# Patient Record
Sex: Male | Born: 1970
Health system: Southern US, Community
[De-identification: ages and names within clinical notes are randomized; demographics above are authoritative.]

## PROBLEM LIST (undated history)

## (undated) DIAGNOSIS — K2950 Unspecified chronic gastritis without bleeding: Secondary | ICD-10-CM

## (undated) DIAGNOSIS — M545 Low back pain, unspecified: Secondary | ICD-10-CM

## (undated) DIAGNOSIS — E785 Hyperlipidemia, unspecified: Secondary | ICD-10-CM

## (undated) DIAGNOSIS — K635 Polyp of colon: Secondary | ICD-10-CM

## (undated) DIAGNOSIS — T7840XA Allergy, unspecified, initial encounter: Secondary | ICD-10-CM

## (undated) DIAGNOSIS — N281 Cyst of kidney, acquired: Secondary | ICD-10-CM

## (undated) DIAGNOSIS — K7689 Other specified diseases of liver: Secondary | ICD-10-CM

## (undated) DIAGNOSIS — K219 Gastro-esophageal reflux disease without esophagitis: Secondary | ICD-10-CM

## (undated) DIAGNOSIS — K449 Diaphragmatic hernia without obstruction or gangrene: Secondary | ICD-10-CM

## (undated) DIAGNOSIS — F419 Anxiety disorder, unspecified: Secondary | ICD-10-CM

## (undated) DIAGNOSIS — B019 Varicella without complication: Secondary | ICD-10-CM

## (undated) DIAGNOSIS — I10 Essential (primary) hypertension: Secondary | ICD-10-CM

## (undated) DIAGNOSIS — D131 Benign neoplasm of stomach: Secondary | ICD-10-CM

## (undated) DIAGNOSIS — K2970 Gastritis, unspecified, without bleeding: Secondary | ICD-10-CM

## (undated) DIAGNOSIS — M1990 Unspecified osteoarthritis, unspecified site: Secondary | ICD-10-CM

## (undated) HISTORY — DX: Unspecified osteoarthritis, unspecified site: M19.90

## (undated) HISTORY — PX: ESOPHAGOGASTRODUODENOSCOPY: SHX1529

## (undated) HISTORY — DX: Cyst of kidney, acquired: N28.1

## (undated) HISTORY — DX: Varicella without complication: B01.9

## (undated) HISTORY — DX: Gastritis, unspecified, without bleeding: K29.70

## (undated) HISTORY — DX: Other specified diseases of liver: K76.89

## (undated) HISTORY — PX: APPENDECTOMY: SHX54

## (undated) HISTORY — PX: COLONOSCOPY W/ POLYPECTOMY: SHX1380

## (undated) HISTORY — DX: Gastro-esophageal reflux disease without esophagitis: K21.9

## (undated) HISTORY — PX: TONSILLECTOMY: SUR1361

## (undated) HISTORY — DX: Anxiety disorder, unspecified: F41.9

## (undated) HISTORY — DX: Low back pain: M54.5

## (undated) HISTORY — DX: Allergy, unspecified, initial encounter: T78.40XA

## (undated) HISTORY — DX: Hyperlipidemia, unspecified: E78.5

## (undated) HISTORY — DX: Diaphragmatic hernia without obstruction or gangrene: K44.9

## (undated) HISTORY — PX: LASIK: SHX215

## (undated) HISTORY — PX: EYE SURGERY: SHX253

## (undated) HISTORY — PX: KNEE SURGERY: SHX244

## (undated) HISTORY — DX: Polyp of colon: K63.5

## (undated) HISTORY — DX: Unspecified chronic gastritis without bleeding: K29.50

## (undated) HISTORY — DX: Low back pain, unspecified: M54.50

## (undated) HISTORY — DX: Essential (primary) hypertension: I10

## (undated) HISTORY — DX: Benign neoplasm of stomach: D13.1

---

## 2018-04-13 DIAGNOSIS — K219 Gastro-esophageal reflux disease without esophagitis: Secondary | ICD-10-CM | POA: Insufficient documentation

## 2018-04-13 DIAGNOSIS — Z860100 Personal history of colon polyps, unspecified: Secondary | ICD-10-CM | POA: Insufficient documentation

## 2018-06-15 DIAGNOSIS — I10 Essential (primary) hypertension: Secondary | ICD-10-CM | POA: Insufficient documentation

## 2018-12-21 ENCOUNTER — Ambulatory Visit: Payer: BLUE CROSS/BLUE SHIELD | Admitting: Internal Medicine

## 2018-12-21 ENCOUNTER — Encounter: Payer: Self-pay | Admitting: Internal Medicine

## 2018-12-21 VITALS — BP 130/92 | HR 63 | Temp 98.7°F | Ht 66.0 in | Wt 159.6 lb

## 2018-12-21 DIAGNOSIS — Z1283 Encounter for screening for malignant neoplasm of skin: Secondary | ICD-10-CM

## 2018-12-21 DIAGNOSIS — Z1159 Encounter for screening for other viral diseases: Secondary | ICD-10-CM

## 2018-12-21 DIAGNOSIS — I10 Essential (primary) hypertension: Secondary | ICD-10-CM | POA: Insufficient documentation

## 2018-12-21 DIAGNOSIS — K219 Gastro-esophageal reflux disease without esophagitis: Secondary | ICD-10-CM

## 2018-12-21 DIAGNOSIS — Z0184 Encounter for antibody response examination: Secondary | ICD-10-CM

## 2018-12-21 DIAGNOSIS — Z1389 Encounter for screening for other disorder: Secondary | ICD-10-CM

## 2018-12-21 DIAGNOSIS — Z1329 Encounter for screening for other suspected endocrine disorder: Secondary | ICD-10-CM | POA: Diagnosis not present

## 2018-12-21 DIAGNOSIS — E559 Vitamin D deficiency, unspecified: Secondary | ICD-10-CM

## 2018-12-21 DIAGNOSIS — Z125 Encounter for screening for malignant neoplasm of prostate: Secondary | ICD-10-CM

## 2018-12-21 MED ORDER — PANTOPRAZOLE SODIUM 40 MG PO TBEC: 40.0000 mg | DELAYED_RELEASE_TABLET | Freq: Every day | ORAL | 3 refills | Status: DC

## 2018-12-21 MED ORDER — LOSARTAN POTASSIUM-HCTZ 50-12.5 MG PO TABS: 1.0000 | ORAL_TABLET | Freq: Every day | ORAL | 3 refills | Status: DC

## 2018-12-21 NOTE — Progress Notes (Signed)
Pre visit review using our clinic review tool, if applicable. No additional management support is needed unless otherwise documented below in the visit note. 

## 2018-12-21 NOTE — Progress Notes (Addendum)
Chief Complaint  Patient presents with  . Establish Care   New patient  1. HTN sl elevated missed hyzaar 50-12.5 last night  2. GERD controlled on protonix 40 mg and diet control   Review of Systems  Constitutional: Negative for weight loss.  HENT: Negative for hearing loss.   Eyes: Negative for blurred vision.  Respiratory: Negative for shortness of breath.   Cardiovascular: Negative for chest pain.  Gastrointestinal: Negative for abdominal pain.  Musculoskeletal: Negative for falls.  Skin: Negative for rash.  Neurological: Negative for headaches.  Psychiatric/Behavioral: Negative for depression.   Past Medical History:  Diagnosis Date  . Chicken pox   . Colon polyps   . GERD (gastroesophageal reflux disease)   . Hypertension    Past Surgical History:  Procedure Laterality Date  . APPENDECTOMY    . KNEE SURGERY     arthroscopy x 2 torn cartilage   . TONSILLECTOMY     Family History  Problem Relation Age of Onset  . Diabetes Mother   . Hypertension Mother   . Diabetes Father   . Hypertension Father   . Hyperlipidemia Father   . Diabetes Maternal Grandmother   . Heart disease Maternal Grandfather        MI age 58s-50s   . Hyperlipidemia Paternal Grandmother   . Diabetes Paternal Grandfather   . Cancer Paternal Grandfather        lung cancer smoker    Social History   Socioeconomic History  . Marital status: Married    Spouse name: Not on file  . Number of children: Not on file  . Years of education: Not on file  . Highest education level: Not on file  Occupational History  . Not on file  Social Needs  . Financial resource strain: Not on file  . Food insecurity:    Worry: Not on file    Inability: Not on file  . Transportation needs:    Medical: Not on file    Non-medical: Not on file  Tobacco Use  . Smoking status: Never Smoker  . Smokeless tobacco: Never Used  Substance and Sexual Activity  . Alcohol use: Yes  . Drug use: Not Currently  .  Sexual activity: Yes  Lifestyle  . Physical activity:    Days per week: Not on file    Minutes per session: Not on file  . Stress: Not on file  Relationships  . Social connections:    Talks on phone: Not on file    Gets together: Not on file    Attends religious service: Not on file    Active member of club or organization: Not on file    Attends meetings of clubs or organizations: Not on file    Relationship status: Not on file  . Intimate partner violence:    Fear of current or ex partner: Not on file    Emotionally abused: Not on file    Physically abused: Not on file    Forced sexual activity: Not on file  Other Topics Concern  . Not on file  Social History Narrative   Married    Moved from [place] in [employer] degree   Kids    No guns   Wears seat belt    Safe in relationship    Current Meds  Medication Sig  . fexofenadine (ALLEGRA) 180 MG tablet Take 180 mg by mouth daily as needed for allergies or rhinitis.  Marland Kitchen losartan-hydrochlorothiazide (HYZAAR) 50-12.5  MG tablet Take 1 tablet by mouth daily.  . pantoprazole (PROTONIX) 40 MG tablet Take 1 tablet (40 mg total) by mouth daily. 30 minutes before food  . [DISCONTINUED] losartan-hydrochlorothiazide (HYZAAR) 50-12.5 MG tablet Take 1 tablet by mouth daily.  . [DISCONTINUED] pantoprazole (PROTONIX) 40 MG tablet Take 40 mg by mouth daily.   Allergies  Allergen Reactions  . Penicillins Rash    Rash as chid but able to tolerate amoxicillin    No results found for this or any previous visit (from the past 2160 hour(s)). Objective  Body mass index is 25.76 kg/m. Wt Readings from Last 3 Encounters:  12/21/18 159 lb 9.6 oz (72.4 kg)   Temp Readings from Last 3 Encounters:  12/21/18 98.7 F (37.1 C) (Oral)   BP Readings from Last 3 Encounters:  12/21/18 (!) 130/92   Pulse Readings from Last 3 Encounters:  12/21/18 63    Physical Exam Vitals signs and nursing note reviewed.  Constitutional:       Appearance: Normal appearance.  HENT:     Head: Normocephalic and atraumatic.     Nose: Nose normal.     Mouth/Throat:     Mouth: Mucous membranes are moist.     Pharynx: Oropharynx is clear.  Eyes:     Conjunctiva/sclera: Conjunctivae normal.     Pupils: Pupils are equal, round, and reactive to light.  Cardiovascular:     Rate and Rhythm: Normal rate and regular rhythm.     Heart sounds: Normal heart sounds.  Pulmonary:     Effort: Pulmonary effort is normal.     Breath sounds: Normal breath sounds.  Skin:    General: Skin is warm and dry.  Neurological:     General: No focal deficit present.     Mental Status: He is alert and oriented to person, place, and time. Mental status is at baseline.     Gait: Gait normal.  Psychiatric:        Attention and Perception: Attention and perception normal.        Mood and Affect: Mood and affect normal.        Speech: Speech normal.        Behavior: Behavior normal. Behavior is cooperative.        Thought Content: Thought content normal.        Cognition and Memory: Cognition and memory normal.        Judgment: Judgment normal.     Assessment   1. HTN 2. GERD 3. Nevi and angiomas 4. HM Plan   1. Refilled hyzaar 50-12.5  2. Refilled protonix 40 mg qd  3. Referred Dr. Kellie Moor dermatology  4.  Never had flu shot  Check vxs (I.e Tdap) old PCP  Consider shingrix vaccine   Check colonoscopy old PCP had x 2 dad h/o polyps not cancerous had age 25 and 53 , also had labs 07/2018 -no colonoscopy in records  Records Dr. Ferol Luz in [place] [phone] received and reviewed  H/o GERD HLD Gastritis/esophagitis  Chronic right sided back pain   Eye exam 2019  Referred Dr. Kellie Moor tbse and disc laser angiomas face  Never smoker  Physical at f/u  Provider: Dr. Olivia Mackie [facility]

## 2018-12-21 NOTE — Patient Instructions (Addendum)
Dr. Hinton Rao dermatology [address] [place] [phone]   Cobb stands for "Dietary Approaches to Stop Hypertension." The DASH eating plan is a healthy eating plan that has been shown to reduce high blood pressure (hypertension). It may also reduce your risk for type 2 diabetes, heart disease, and stroke. The DASH eating plan may also help with weight loss. What are tips for following this plan?  General guidelines  Avoid eating more than 2,300 mg (milligrams) of salt (sodium) a day. If you have hypertension, you may need to reduce your sodium intake to 1,500 mg a day.  Limit alcohol intake to no more than 1 drink a day for nonpregnant women and 2 drinks a day for men. One drink equals 12 oz of beer, 5 oz of wine, or 1 oz of hard liquor.  Work with your health care provider to maintain a healthy body weight or to lose weight. Ask what an ideal weight is for you.  Get at least 30 minutes of exercise that causes your heart to beat faster (aerobic exercise) most days of the week. Activities may include walking, swimming, or biking.  Work with your health care provider or diet and nutrition specialist (dietitian) to adjust your eating plan to your individual calorie needs. Reading food labels   Check food labels for the amount of sodium per serving. Choose foods with less than 5 percent of the Daily Value of sodium. Generally, foods with less than 300 mg of sodium per serving fit into this eating plan.  To find whole grains, look for the word "whole" as the first word in the ingredient list. Shopping  Buy products labeled as "low-sodium" or "no salt added."  Buy fresh foods. Avoid canned foods and premade or frozen meals. Cooking  Avoid adding salt when cooking. Use salt-free seasonings or herbs instead of table salt or sea salt. Check with your health care provider or pharmacist before using salt substitutes.  Do not fry foods. Cook foods using healthy methods such  as baking, boiling, grilling, and broiling instead.  Cook with heart-healthy oils, such as olive, canola, soybean, or sunflower oil. Meal planning  Eat a balanced diet that includes: ? 5 or more servings of fruits and vegetables each day. At each meal, try to fill half of your plate with fruits and vegetables. ? Up to 6-8 servings of whole grains each day. ? Less than 6 oz of lean meat, poultry, or fish each day. A 3-oz serving of meat is about the same size as a deck of cards. One egg equals 1 oz. ? 2 servings of low-fat dairy each day. ? A serving of nuts, seeds, or beans 5 times each week. ? Heart-healthy fats. Healthy fats called Omega-3 fatty acids are found in foods such as flaxseeds and coldwater fish, like sardines, salmon, and mackerel.  Limit how much you eat of the following: ? Canned or prepackaged foods. ? Food that is high in trans fat, such as fried foods. ? Food that is high in saturated fat, such as fatty meat. ? Sweets, desserts, sugary drinks, and other foods with added sugar. ? Full-fat dairy products.  Do not salt foods before eating.  Try to eat at least 2 vegetarian meals each week.  Eat more home-cooked food and less restaurant, buffet, and fast food.  When eating at a restaurant, ask that your food be prepared with less salt or no salt, if possible. What foods are recommended? The  items listed may not be a complete list. Talk with your dietitian about what dietary choices are best for you. Grains Whole-grain or whole-wheat bread. Whole-grain or whole-wheat pasta. Brown rice. [place] Morrow. Bulgur. Whole-grain and low-sodium cereals. Pita bread. Low-fat, low-sodium crackers. Whole-wheat flour tortillas. Vegetables Fresh or frozen vegetables (raw, steamed, roasted, or grilled). Low-sodium or reduced-sodium tomato and vegetable juice. Low-sodium or reduced-sodium tomato sauce and tomato paste. Low-sodium or reduced-sodium canned vegetables. Fruits All fresh,  dried, or frozen fruit. Canned fruit in natural juice (without added sugar). Meat and other protein foods Skinless chicken or [place]. Ground chicken or [place]. Pork with fat trimmed off. Fish and seafood. Egg whites. Dried beans, peas, or lentils. Unsalted nuts, nut butters, and seeds. Unsalted canned beans. Lean cuts of beef with fat trimmed off. Low-sodium, lean deli meat. Dairy Low-fat (1%) or fat-free (skim) milk. Fat-free, low-fat, or reduced-fat cheeses. Nonfat, low-sodium ricotta or cottage cheese. Low-fat or nonfat yogurt. Low-fat, low-sodium cheese. Fats and oils Soft margarine without trans fats. Vegetable oil. Low-fat, reduced-fat, or light mayonnaise and salad dressings (reduced-sodium). Canola, safflower, olive, soybean, and sunflower oils. Avocado. Seasoning and other foods Herbs. Spices. Seasoning mixes without salt. Unsalted popcorn and pretzels. Fat-free sweets. What foods are not recommended? The items listed may not be a complete list. Talk with your dietitian about what dietary choices are best for you. Grains Baked goods made with fat, such as croissants, muffins, or some breads. Dry pasta or rice meal packs. Vegetables Creamed or fried vegetables. Vegetables in a cheese sauce. Regular canned vegetables (not low-sodium or reduced-sodium). Regular canned tomato sauce and paste (not low-sodium or reduced-sodium). Regular tomato and vegetable juice (not low-sodium or reduced-sodium). Angie Fava. Olives. Fruits Canned fruit in a light or heavy syrup. Fried fruit. Fruit in cream or butter sauce. Meat and other protein foods Fatty cuts of meat. Ribs. Fried meat. Berniece Salines. Sausage. Bologna and other processed lunch meats. Salami. Fatback. Hotdogs. Bratwurst. Salted nuts and seeds. Canned beans with added salt. Canned or smoked fish. Whole eggs or egg yolks. Chicken or [place] with skin. Dairy Whole or 2% milk, cream, and half-and-half. Whole or full-fat cream cheese. Whole-fat or sweetened  yogurt. Full-fat cheese. Nondairy creamers. Whipped toppings. Processed cheese and cheese spreads. Fats and oils Butter. Stick margarine. Lard. Shortening. Ghee. Bacon fat. Tropical oils, such as coconut, palm kernel, or palm oil. Seasoning and other foods Salted popcorn and pretzels. Onion salt, garlic salt, seasoned salt, table salt, and sea salt. Worcestershire sauce. Tartar sauce. Barbecue sauce. Teriyaki sauce. Soy sauce, including reduced-sodium. Steak sauce. Canned and packaged gravies. Fish sauce. Oyster sauce. Cocktail sauce. Horseradish that you find on the shelf. Ketchup. Mustard. Meat flavorings and tenderizers. Bouillon cubes. Hot sauce and Tabasco sauce. Premade or packaged marinades. Premade or packaged taco seasonings. Relishes. Regular salad dressings. Where to find more information:  National Heart, Lung, and [place]: https://wilson-eaton.com/  American Heart Association: www.heart.org Summary  The DASH eating plan is a healthy eating plan that has been shown to reduce high blood pressure (hypertension). It may also reduce your risk for type 2 diabetes, heart disease, and stroke.  With the DASH eating plan, you should limit salt (sodium) intake to 2,300 mg a day. If you have hypertension, you may need to reduce your sodium intake to 1,500 mg a day.  When on the DASH eating plan, aim to eat more fresh fruits and vegetables, whole grains, lean proteins, low-fat dairy, and heart-healthy fats.  Work with your health care provider  or diet and nutrition specialist (dietitian) to adjust your eating plan to your individual calorie needs. This information is not intended to replace advice given to you by your health care provider. Make sure you discuss any questions you have with your health care provider. Document Released: 12/04/2011 Document Revised: 12/08/2016 Document Reviewed: 12/08/2016 Elsevier Interactive Patient Education  2019 [employer].  Hypertension Hypertension,  commonly called high blood pressure, is when the force of blood pumping through the arteries is too strong. The arteries are the blood vessels that carry blood from the heart throughout the body. Hypertension forces the heart to work harder to pump blood and may cause arteries to become narrow or stiff. Having untreated or uncontrolled hypertension can cause heart attacks, strokes, kidney disease, and other problems. A blood pressure reading consists of a higher number over a lower number. Ideally, your blood pressure should be below 120/80. The first ("top") number is called the systolic pressure. It is a measure of the pressure in your arteries as your heart beats. The second ("bottom") number is called the diastolic pressure. It is a measure of the pressure in your arteries as the heart relaxes. What are the causes? The cause of this condition is not known. What increases the risk? Some risk factors for high blood pressure are under your control. Others are not. Factors you can change  Smoking.  Having type 2 diabetes mellitus, high cholesterol, or both.  Not getting enough exercise or physical activity.  Being overweight.  Having too much fat, sugar, calories, or salt (sodium) in your diet.  Drinking too much alcohol. Factors that are difficult or impossible to change  Having chronic kidney disease.  Having a family history of high blood pressure.  Age. Risk increases with age.  Race. You may be at higher risk if you are African-American.  Gender. Men are at higher risk than women before age 7. After age 14, women are at higher risk than men.  Having obstructive sleep apnea.  Stress. What are the signs or symptoms? Extremely high blood pressure (hypertensive crisis) may cause:  Headache.  Anxiety.  Shortness of breath.  Nosebleed.  Nausea and vomiting.  Severe chest pain.  Jerky movements you cannot control (seizures). How is this diagnosed? This condition is  diagnosed by measuring your blood pressure while you are seated, with your arm resting on a surface. The cuff of the blood pressure monitor will be placed directly against the skin of your upper arm at the level of your heart. It should be measured at least twice using the same arm. Certain conditions can cause a difference in blood pressure between your right and left arms. Certain factors can cause blood pressure readings to be lower or higher than normal (elevated) for a short period of time:  When your blood pressure is higher when you are in a health care provider's office than when you are at home, this is called white coat hypertension. Most people with this condition do not need medicines.  When your blood pressure is higher at home than when you are in a health care provider's office, this is called masked hypertension. Most people with this condition may need medicines to control blood pressure. If you have a high blood pressure reading during one visit or you have normal blood pressure with other risk factors:  You may be asked to return on a different day to have your blood pressure checked again.  You may be asked to monitor your blood  pressure at home for 1 week or longer. If you are diagnosed with hypertension, you may have other blood or imaging tests to help your health care provider understand your overall risk for other conditions. How is this treated? This condition is treated by making healthy lifestyle changes, such as eating healthy foods, exercising more, and reducing your alcohol intake. Your health care provider may prescribe medicine if lifestyle changes are not enough to get your blood pressure under control, and if:  Your systolic blood pressure is above 130.  Your diastolic blood pressure is above 80. Your personal target blood pressure may vary depending on your medical conditions, your age, and other factors. Follow these instructions at home: Eating and  drinking   Eat a diet that is high in fiber and potassium, and low in sodium, added sugar, and fat. An example eating plan is called the DASH (Dietary Approaches to Stop Hypertension) diet. To eat this way: ? Eat plenty of fresh fruits and vegetables. Try to fill half of your plate at each meal with fruits and vegetables. ? Eat whole grains, such as whole wheat pasta, brown rice, or whole grain bread. Fill about one quarter of your plate with whole grains. ? Eat or drink low-fat dairy products, such as skim milk or low-fat yogurt. ? Avoid fatty cuts of meat, processed or cured meats, and poultry with skin. Fill about one quarter of your plate with lean proteins, such as fish, chicken without skin, beans, eggs, and tofu. ? Avoid premade and processed foods. These tend to be higher in sodium, added sugar, and fat.  Reduce your daily sodium intake. Most people with hypertension should eat less than 1,500 mg of sodium a day.  Limit alcohol intake to no more than 1 drink a day for nonpregnant women and 2 drinks a day for men. One drink equals 12 oz of beer, 5 oz of wine, or 1 oz of hard liquor. Lifestyle   Work with your health care provider to maintain a healthy body weight or to lose weight. Ask what an ideal weight is for you.  Get at least 30 minutes of exercise that causes your heart to beat faster (aerobic exercise) most days of the week. Activities may include walking, swimming, or biking.  Include exercise to strengthen your muscles (resistance exercise), such as pilates or lifting weights, as part of your weekly exercise routine. Try to do these types of exercises for 30 minutes at least 3 days a week.  Do not use any products that contain nicotine or tobacco, such as cigarettes and e-cigarettes. If you need help quitting, ask your health care provider.  Monitor your blood pressure at home as told by your health care provider.  Keep all follow-up visits as told by your health care  provider. This is important. Medicines  Take over-the-counter and prescription medicines only as told by your health care provider. Follow directions carefully. Blood pressure medicines must be taken as prescribed.  Do not skip doses of blood pressure medicine. Doing this puts you at risk for problems and can make the medicine less effective.  Ask your health care provider about side effects or reactions to medicines that you should watch for. Contact a health care provider if:  You think you are having a reaction to a medicine you are taking.  You have headaches that keep coming back (recurring).  You feel dizzy.  You have swelling in your ankles.  You have trouble with your vision. Get help  right away if:  You develop a severe headache or confusion.  You have unusual weakness or numbness.  You feel faint.  You have severe pain in your chest or abdomen.  You vomit repeatedly.  You have trouble breathing. Summary  Hypertension is when the force of blood pumping through your arteries is too strong. If this condition is not controlled, it may put you at risk for serious complications.  Your personal target blood pressure may vary depending on your medical conditions, your age, and other factors. For most people, a normal blood pressure is less than 120/80.  Hypertension is treated with lifestyle changes, medicines, or a combination of both. Lifestyle changes include weight loss, eating a healthy, low-sodium diet, exercising more, and limiting alcohol. This information is not intended to replace advice given to you by your health care provider. Make sure you discuss any questions you have with your health care provider. Document Released: 12/15/2005 Document Revised: 11/12/2016 Document Reviewed: 11/12/2016 Elsevier Interactive Patient Education  2019 [employer].

## 2019-01-05 ENCOUNTER — Ambulatory Visit: Payer: Self-pay | Admitting: Physician Assistant

## 2019-01-06 ENCOUNTER — Encounter: Payer: Self-pay | Admitting: Internal Medicine

## 2019-02-08 ENCOUNTER — Other Ambulatory Visit (INDEPENDENT_AMBULATORY_CARE_PROVIDER_SITE_OTHER): Payer: BLUE CROSS/BLUE SHIELD

## 2019-02-08 DIAGNOSIS — Z1389 Encounter for screening for other disorder: Secondary | ICD-10-CM

## 2019-02-08 DIAGNOSIS — I10 Essential (primary) hypertension: Secondary | ICD-10-CM | POA: Diagnosis not present

## 2019-02-08 DIAGNOSIS — Z125 Encounter for screening for malignant neoplasm of prostate: Secondary | ICD-10-CM | POA: Diagnosis not present

## 2019-02-08 DIAGNOSIS — Z1329 Encounter for screening for other suspected endocrine disorder: Secondary | ICD-10-CM | POA: Diagnosis not present

## 2019-02-08 DIAGNOSIS — Z0184 Encounter for antibody response examination: Secondary | ICD-10-CM

## 2019-02-08 DIAGNOSIS — E559 Vitamin D deficiency, unspecified: Secondary | ICD-10-CM

## 2019-02-08 DIAGNOSIS — Z1159 Encounter for screening for other viral diseases: Secondary | ICD-10-CM

## 2019-02-08 LAB — COMPREHENSIVE METABOLIC PANEL
ALT: 31 U/L (ref 0–53)
AST: 25 U/L (ref 0–37)
Albumin: 4.7 g/dL (ref 3.5–5.2)
Alkaline Phosphatase: 84 U/L (ref 39–117)
BUN: 13 mg/dL (ref 6–23)
CHLORIDE: 103 meq/L (ref 96–112)
CO2: 28 mEq/L (ref 19–32)
Calcium: 9.5 mg/dL (ref 8.4–10.5)
Creatinine, Ser: 1.07 mg/dL (ref 0.40–1.50)
GFR: 73.15 mL/min (ref >60.00)
Glucose, Bld: 93 mg/dL (ref 70–99)
POTASSIUM: 4 meq/L (ref 3.5–5.1)
SODIUM: 140 meq/L (ref 135–145)
Total Bilirubin: 1 mg/dL (ref 0.2–1.2)
Total Protein: 6.8 g/dL (ref 6.0–8.3)

## 2019-02-08 LAB — CBC WITH DIFFERENTIAL/PLATELET
BASOS ABS: 0.1 10*3/uL (ref 0.0–0.1)
Basophils Relative: 2.1 % (ref 0.0–3.0)
Eosinophils Absolute: 0.2 10*3/uL (ref 0.0–0.7)
Eosinophils Relative: 4 % (ref 0.0–5.0)
HCT: 46 % (ref 39.0–52.0)
Hemoglobin: 16 g/dL (ref 13.0–17.0)
Lymphocytes Relative: 24.6 % (ref 12.0–46.0)
Lymphs Abs: 1.2 10*3/uL (ref 0.7–4.0)
MCHC: 34.7 g/dL (ref 30.0–36.0)
MCV: 86.8 fl (ref 78.0–100.0)
Monocytes Absolute: 0.4 10*3/uL (ref 0.1–1.0)
Monocytes Relative: 8.1 % (ref 3.0–12.0)
NEUTROS PCT: 61.2 % (ref 43.0–77.0)
Neutro Abs: 3 10*3/uL (ref 1.4–7.7)
Platelets: 195 10*3/uL (ref 150.0–400.0)
RBC: 5.3 Mil/uL (ref 4.22–5.81)
RDW: 13.6 % (ref 11.5–15.5)
WBC: 5 10*3/uL (ref 4.0–10.5)

## 2019-02-08 LAB — LIPID PANEL
Cholesterol: 224 mg/dL — ABNORMAL HIGH (ref 0–200)
HDL: 37.7 mg/dL — ABNORMAL LOW (ref >39.00)
LDL CALC: 146 mg/dL — AB (ref 0–99)
NonHDL: 185.81
Total CHOL/HDL Ratio: 6
Triglycerides: 197 mg/dL — ABNORMAL HIGH (ref 0.0–149.0)
VLDL: 39.4 mg/dL (ref 0.0–40.0)

## 2019-02-08 LAB — TSH: TSH: 1.38 u[IU]/mL (ref 0.35–4.50)

## 2019-02-08 LAB — T4, FREE: Free T4: 0.85 ng/dL (ref 0.60–1.60)

## 2019-02-08 LAB — PSA: PSA: 0.8 ng/mL (ref 0.10–4.00)

## 2019-02-08 LAB — VITAMIN D 25 HYDROXY (VIT D DEFICIENCY, FRACTURES): VITD: 50.38 ng/mL (ref 30.00–100.00)

## 2019-02-08 NOTE — Addendum Note (Signed)
Addended by: Arby Barrette on: 02/08/2019 07:57 AM   Modules accepted: Orders

## 2019-02-09 ENCOUNTER — Telehealth: Payer: Self-pay | Admitting: Internal Medicine

## 2019-02-09 LAB — URINALYSIS, ROUTINE W REFLEX MICROSCOPIC
Bilirubin, UA: NEGATIVE
Glucose, UA: NEGATIVE
Ketones, UA: NEGATIVE
Leukocytes, UA: NEGATIVE
Nitrite, UA: NEGATIVE
Protein, UA: NEGATIVE
RBC, UA: NEGATIVE
Specific Gravity, UA: 1.02 (ref 1.005–1.030)
UUROB: 0.2 mg/dL (ref 0.2–1.0)
pH, UA: 5 (ref 5.0–7.5)

## 2019-02-09 LAB — HEPATITIS B SURFACE ANTIBODY, QUANTITATIVE

## 2019-02-09 LAB — MEASLES/MUMPS/RUBELLA IMMUNITY
Mumps IgG: 40 AU/mL
Rubella: <0.9 index — ABNORMAL LOW
Rubeola IgG: 18.7 AU/mL

## 2019-02-09 NOTE — Telephone Encounter (Signed)
Entered in error  TMS 

## 2019-02-15 ENCOUNTER — Encounter: Payer: BLUE CROSS/BLUE SHIELD | Admitting: Internal Medicine

## 2019-02-21 ENCOUNTER — Ambulatory Visit (INDEPENDENT_AMBULATORY_CARE_PROVIDER_SITE_OTHER): Payer: BLUE CROSS/BLUE SHIELD | Admitting: Family Medicine

## 2019-02-21 VITALS — BP 110/86 | HR 75 | Temp 98.1°F | Resp 16 | Ht 66.0 in | Wt 159.4 lb

## 2019-02-21 DIAGNOSIS — R0982 Postnasal drip: Secondary | ICD-10-CM

## 2019-02-21 DIAGNOSIS — H6692 Otitis media, unspecified, left ear: Secondary | ICD-10-CM

## 2019-02-21 DIAGNOSIS — R070 Pain in throat: Secondary | ICD-10-CM | POA: Diagnosis not present

## 2019-02-21 LAB — POC INFLUENZA A&B (BINAX/QUICKVUE)
Influenza A, POC: NEGATIVE
Influenza B, POC: NEGATIVE

## 2019-02-21 LAB — POCT RAPID STREP A (OFFICE): RAPID STREP A SCREEN: NEGATIVE

## 2019-02-21 MED ORDER — AMOXICILLIN-POT CLAVULANATE 875-125 MG PO TABS: 1.0000 | ORAL_TABLET | Freq: Two times a day (BID) | ORAL | 0 refills | Status: DC

## 2019-02-21 NOTE — Progress Notes (Signed)
Subjective:    Patient ID: Anthony Patton, male    DOB: 1971/07/28, 48 y.o.   MRN: [id number]  HPI  Presents to clinic c/o cough, ear pain, congestion and sore throat for 4-5 days.  Patient does take Allegra daily, the past few days he has done Allegra-D with mild amount of help and improving congestion symptoms.  Left ear is more painful than right.  Feels like he is constantly clearing throat.  Throat feels scratchy and as if it is on fire at times.  Denies body aches.  Denies nausea/vomiting or diarrhea.  Did have fever on the first day of illness, but no fever or chills since.  Patient Active Problem List   Diagnosis Date Noted  . Essential hypertension 12/21/2018  . Gastroesophageal reflux disease 12/21/2018   Social History   Tobacco Use  . Smoking status: Never Smoker  . Smokeless tobacco: Never Used  Substance Use Topics  . Alcohol use: Yes   Review of Systems   Constitutional: Fever on day 1 of illness, feels run down.   HENT: +sore throat, ear pain (left more than right), ear congestion, drainage in throat.  Eyes: Negative.   Respiratory: Negative for  shortness of breath and wheezing.  +cough Cardiovascular: Negative for chest pain, palpitations and leg swelling.  Gastrointestinal: Negative for abdominal pain, diarrhea, nausea and vomiting.  Genitourinary: Negative for dysuria, frequency and urgency.  Musculoskeletal: Negative for arthralgias and myalgias.  Skin: Negative for color change, pallor and rash.  Neurological: Negative for syncope, light-headedness and headaches.  Psychiatric/Behavioral: The patient is not nervous/anxious.       Objective:   Physical Exam Vitals signs reviewed.  Constitutional:      General: He is not in acute distress.    Appearance: He is not ill-appearing or toxic-appearing.  HENT:     Head: Normocephalic and atraumatic.     Right Ear: Tenderness present. A middle ear effusion is present. Tympanic membrane is injected,  erythematous and bulging.     Left Ear: Tenderness present. A middle ear effusion is present.     Nose: Congestion and rhinorrhea present.     Mouth/Throat:     Mouth: Mucous membranes are moist.     Pharynx: Posterior oropharyngeal erythema (post nasal drainage) present. No oropharyngeal exudate.  Eyes:     General: No scleral icterus.    Extraocular Movements: Extraocular movements intact.     Pupils: Pupils are equal, round, and reactive to light.  Neck:     Musculoskeletal: Neck supple. No neck rigidity.  Cardiovascular:     Rate and Rhythm: Normal rate and regular rhythm.  Pulmonary:     Effort: Pulmonary effort is normal. No respiratory distress.     Breath sounds: Normal breath sounds.  Lymphadenopathy:     Cervical: No cervical adenopathy.  Skin:    General: Skin is warm and dry.     Coloration: Skin is not jaundiced or pale.  Neurological:     Mental Status: He is alert and oriented to person, place, and time.  Psychiatric:        Mood and Affect: Mood normal.        Behavior: Behavior normal.    Vitals:   02/21/19 1359  BP: 110/86  Pulse: 75  Resp: 16  Temp: 98.1 F (36.7 C)  SpO2: 98%      Assessment & Plan:   Left otitis media - patient will take Augmentin twice daily to treat ear infection.  Advised  he can take Allegra-D to help reduce congestion symptoms.  Also advised to rest, get in plenty of fluids and do good handwashing.  Patient aware he can take use Tylenol or Motrin as needed for any pain.  Postnasal drainage/pain in throat - point-of-care flu or strep swabs are negative in clinic.  Suspect throat pain is related to postnasal drainage.  Allegra will help improve this.  Also advised he can do salt water gargles to help improve throat pain.  Patient will keep planned follow-up with PCP as scheduled.  He will return to clinic sooner if any issues arise.

## 2019-02-24 ENCOUNTER — Ambulatory Visit (INDEPENDENT_AMBULATORY_CARE_PROVIDER_SITE_OTHER): Payer: BLUE CROSS/BLUE SHIELD

## 2019-02-24 ENCOUNTER — Encounter: Payer: Self-pay | Admitting: Internal Medicine

## 2019-02-24 ENCOUNTER — Telehealth: Payer: Self-pay

## 2019-02-24 ENCOUNTER — Ambulatory Visit (INDEPENDENT_AMBULATORY_CARE_PROVIDER_SITE_OTHER): Payer: BLUE CROSS/BLUE SHIELD | Admitting: Internal Medicine

## 2019-02-24 VITALS — BP 128/86 | HR 70 | Temp 97.7°F | Ht 66.0 in | Wt 160.8 lb

## 2019-02-24 DIAGNOSIS — E785 Hyperlipidemia, unspecified: Secondary | ICD-10-CM

## 2019-02-24 DIAGNOSIS — I10 Essential (primary) hypertension: Secondary | ICD-10-CM

## 2019-02-24 DIAGNOSIS — G8929 Other chronic pain: Secondary | ICD-10-CM

## 2019-02-24 DIAGNOSIS — M5441 Lumbago with sciatica, right side: Secondary | ICD-10-CM

## 2019-02-24 DIAGNOSIS — Z0001 Encounter for general adult medical examination with abnormal findings: Secondary | ICD-10-CM

## 2019-02-24 DIAGNOSIS — J309 Allergic rhinitis, unspecified: Secondary | ICD-10-CM | POA: Diagnosis not present

## 2019-02-24 DIAGNOSIS — Z0000 Encounter for general adult medical examination without abnormal findings: Secondary | ICD-10-CM | POA: Insufficient documentation

## 2019-02-24 DIAGNOSIS — J069 Acute upper respiratory infection, unspecified: Secondary | ICD-10-CM

## 2019-02-24 MED ORDER — MONTELUKAST SODIUM 10 MG PO TABS: 10.0000 mg | ORAL_TABLET | Freq: Every day | ORAL | 0 refills | Status: DC

## 2019-02-24 NOTE — Telephone Encounter (Signed)
Copied from [facility] #[id number]. Topic: General - Other >> Feb 24, 2019  3:55 PM Bea Graff, NT wrote: Reason for CRM: Pt states he called his previous PCP and they stated they sent the medical records to this office via [place] and was shipped 12/28/2018. He states his last colonoscopy date was 02/2014. Also last tdap they could not find.

## 2019-02-24 NOTE — Progress Notes (Signed)
Chief Complaint  Patient presents with  . Annual Exam   Annual  1. Chronic low back pain rad to right hip flares intermittently at times feels like will fall h/o Xray years ago. Nothing tried pain not present today  2. Ear pain better left side on Augmentin still with cough  3. HTN sl elevated today on hyzaar  50-12.5 does not want to increase BP meds for now will monitor  4. HLD will monitor and repeat in 6 months FH HLD  5. Reviewed labs  Review of Systems  Constitutional: Negative for weight loss.  HENT: Negative for ear pain and hearing loss.   Eyes: Negative for blurred vision.  Respiratory: Positive for cough and sputum production.   Cardiovascular: Negative for chest pain.  Gastrointestinal: Negative for abdominal pain.  Musculoskeletal: Positive for back pain. Negative for falls.  Skin: Negative for rash.  Endo/Heme/Allergies: Positive for environmental allergies.  Psychiatric/Behavioral: Negative for depression.   Past Medical History:  Diagnosis Date  . Allergy   . Chicken pox   . Colon polyps   . Gastritis    with esophagitis   . GERD (gastroesophageal reflux disease)   . Hyperlipidemia   . Hypertension   . Low back pain    Past Surgical History:  Procedure Laterality Date  . APPENDECTOMY    . COLONOSCOPY W/ POLYPECTOMY    . KNEE SURGERY     arthroscopy x 2 torn cartilage   . LASIK     b/l  . TONSILLECTOMY     Family History  Problem Relation Age of Onset  . Diabetes Mother   . Hypertension Mother   . Diabetes Father   . Hypertension Father   . Hyperlipidemia Father   . Diabetes Maternal Grandmother   . Heart disease Maternal Grandfather        MI age 55s-50s   . Hyperlipidemia Paternal Grandmother   . Diabetes Paternal Grandfather   . Cancer Paternal Grandfather        lung cancer smoker    Social History   Socioeconomic History  . Marital status: Married    Spouse name: Not on file  . Number of children: Not on file  . Years of education:  Not on file  . Highest education level: Not on file  Occupational History  . Not on file  Social Needs  . Financial resource strain: Not on file  . Food insecurity:    Worry: Not on file    Inability: Not on file  . Transportation needs:    Medical: Not on file    Non-medical: Not on file  Tobacco Use  . Smoking status: Never Smoker  . Smokeless tobacco: Never Used  Substance and Sexual Activity  . Alcohol use: Yes  . Drug use: Not Currently  . Sexual activity: Yes  Lifestyle  . Physical activity:    Days per week: Not on file    Minutes per session: Not on file  . Stress: Not on file  Relationships  . Social connections:    Talks on phone: Not on file    Gets together: Not on file    Attends religious service: Not on file    Active member of club or organization: Not on file    Attends meetings of clubs or organizations: Not on file    Relationship status: Not on file  . Intimate partner violence:    Fear of current or ex partner: Not on file    Emotionally  abused: Not on file    Physically abused: Not on file    Forced sexual activity: Not on file  Other Topics Concern  . Not on file  Social History Narrative   Married    Moved from [place] in [employer] degree   Kids x2   No guns   Wears seat belt    Safe in relationship    Current Meds  Medication Sig  . amoxicillin-clavulanate (AUGMENTIN) 875-125 MG tablet Take 1 tablet by mouth 2 (two) times daily.  . fexofenadine (ALLEGRA) 180 MG tablet Take 180 mg by mouth daily as needed for allergies or rhinitis.  Marland Kitchen losartan-hydrochlorothiazide (HYZAAR) 50-12.5 MG tablet Take 1 tablet by mouth daily.  . pantoprazole (PROTONIX) 40 MG tablet Take 1 tablet (40 mg total) by mouth daily. 30 minutes before food   Allergies  Allergen Reactions  . Penicillins Rash    Rash as chid but able to tolerate amoxicillin    Recent Results (from the past 2160 hour(s))  Urinalysis, Routine w reflex microscopic     Status: None    Collection Time: 02/08/19  8:01 AM  Result Value Ref Range   Specific Gravity, UA 1.020 1.005 - 1.030   pH, UA 5.0 5.0 - 7.5   Color, UA Yellow Yellow   Appearance Ur Clear Clear   Leukocytes, UA Negative Negative   Protein, UA Negative Negative/Trace   Glucose, UA Negative Negative   Ketones, UA Negative Negative   RBC, UA Negative Negative   Bilirubin, UA Negative Negative   Urobilinogen, Ur 0.2 0.2 - 1.0 mg/dL   Nitrite, UA Negative Negative   Microscopic Examination Comment     Comment: Microscopic not indicated and not performed.  PSA     Status: None   Collection Time: 02/08/19  8:01 AM  Result Value Ref Range   PSA 0.80 0.10 - 4.00 ng/mL    Comment: Test performed using Access [employer]britech PSA Assay, a parmagnetic partical, chemiluminecent immunoassay.  Hepatitis B surface antibody,quantitative     Status: Abnormal   Collection Time: 02/08/19  8:01 AM  Result Value Ref Range   Hepatitis B-Post <5 (L) > OR = 10 mIU/mL    Comment: . Patient does not have immunity to hepatitis B virus. . For additional information, please refer to http://education.questdiagn[facility].com/faq/FAQ105 (This link is being provided for informational/ educational purposes only).   Measles/Mumps/Rubella Immunity     Status: Abnormal   Collection Time: 02/08/19  8:01 AM  Result Value Ref Range   R[place]ola IgG 18.70 AU/mL    Comment: AU/mL            Interpretation -----            -------------- <13.50           Negative 13.50-16.49      Equivocal >16.49           Positive . A positive result indicates that the patient has antibody to measles virus. It does not differentiate  between an active or past infection. The clinical  diagnosis must be interpreted in conjunction with  clinical signs and symptoms of the patient.    Mumps IgG 40.00 AU/mL    Comment:  AU/mL           Interpretation -------         ---------------- <9.00             Negative 9.00-10.99        Equivocal >10.99  Positive A positive result indicates that the patient has  antibody to mumps virus. It does not differentiate between an  active or past infection. The clinical diagnosis must be interpreted in conjunction with clinical signs and symptoms of the patient. .    Rubella <0.90 (L) index    Comment:     Index            Interpretation     -----            --------------       <0.90            Not consistent with Immunity     0.90-0.99        Equivocal     > or = 1.00      Consistent with Immunity  . The presence of rubella IgG antibody suggests  immunization or past or current infection with rubella virus.   Vitamin D (25 hydroxy)     Status: None   Collection Time: 02/08/19  8:01 AM  Result Value Ref Range   VITD 50.38 30.00 - 100.00 ng/mL  T4, free     Status: None   Collection Time: 02/08/19  8:01 AM  Result Value Ref Range   Free T4 0.85 0.60 - 1.60 ng/dL    Comment: Specimens from patients who are undergoing biotin therapy and /or ingesting biotin supplements may contain high levels of biotin.  The higher biotin concentration in these specimens interferes with this Free T4 assay.  Specimens that contain high levels  of biotin may cause false high results for this Free T4 assay.  Please interpret results in light of the total clinical presentation of the patient.    TSH     Status: None   Collection Time: 02/08/19  8:01 AM  Result Value Ref Range   TSH 1.38 0.35 - 4.50 uIU/mL  Lipid panel     Status: Abnormal   Collection Time: 02/08/19  8:01 AM  Result Value Ref Range   Cholesterol 224 (H) 0 - 200 mg/dL    Comment: ATP III Classification       Desirable:  < 200 mg/dL               Borderline High:  200 - 239 mg/dL          High:  > = 240 mg/dL   Triglycerides 197.0 (H) 0.0 - 149.0 mg/dL    Comment: Normal:  <150 mg/dLBorderline High:  150 - 199 mg/dL   HDL 37.70 (L) >39.00 mg/dL   VLDL 39.4 0.0 - 40.0 mg/dL   LDL Cholesterol 146 (H) 0 - 99 mg/dL   Total CHOL/HDL Ratio 6      Comment:                Men          Women1/2 Average Risk     3.4          3.3Average Risk          5.0          4.42X Average Risk          9.6          7.13X Average Risk          15.0          11.0                       NonHDL 185.81  Comment: NOTE:  Non-HDL goal should be 30 mg/dL higher than patient's LDL goal (i.e. LDL goal of < 70 mg/dL, would have non-HDL goal of < 100 mg/dL)  CBC with Differential/Platelet     Status: None   Collection Time: 02/08/19  8:01 AM  Result Value Ref Range   WBC 5.0 4.0 - 10.5 K/uL   RBC 5.30 4.22 - 5.81 Mil/uL   Hemoglobin 16.0 13.0 - 17.0 g/dL   HCT 46.0 39.0 - 52.0 %   MCV 86.8 78.0 - 100.0 fl   MCHC 34.7 30.0 - 36.0 g/dL   RDW 13.6 11.5 - 15.5 %   Platelets 195.0 150.0 - 400.0 K/uL   Neutrophils Relative % 61.2 43.0 - 77.0 %   Lymphocytes Relative 24.6 12.0 - 46.0 %   Monocytes Relative 8.1 3.0 - 12.0 %   Eosinophils Relative 4.0 0.0 - 5.0 %   Basophils Relative 2.1 0.0 - 3.0 %   Neutro Abs 3.0 1.4 - 7.7 K/uL   Lymphs Abs 1.2 0.7 - 4.0 K/uL   Monocytes Absolute 0.4 0.1 - 1.0 K/uL   Eosinophils Absolute 0.2 0.0 - 0.7 K/uL   Basophils Absolute 0.1 0.0 - 0.1 K/uL  Comprehensive metabolic panel     Status: None   Collection Time: 02/08/19  8:01 AM  Result Value Ref Range   Sodium 140 135 - 145 mEq/L   Potassium 4.0 3.5 - 5.1 mEq/L   Chloride 103 96 - 112 mEq/L   CO2 28 19 - 32 mEq/L   Glucose, Bld 93 70 - 99 mg/dL   BUN 13 6 - 23 mg/dL   Creatinine, Ser 1.07 0.40 - 1.50 mg/dL   Total Bilirubin 1.0 0.2 - 1.2 mg/dL   Alkaline Phosphatase 84 39 - 117 U/L   AST 25 0 - 37 U/L   ALT 31 0 - 53 U/L   Total Protein 6.8 6.0 - 8.3 g/dL   Albumin 4.7 3.5 - 5.2 g/dL   Calcium 9.5 8.4 - 10.5 mg/dL   GFR 73.15 >60.00 mL/min  POC Influenza A&B(BINAX/QUICKVUE)     Status: Normal   Collection Time: 02/21/19  2:02 PM  Result Value Ref Range   Influenza A, POC Negative Negative   Influenza B, POC Negative Negative  POCT rapid strep A     Status:  Normal   Collection Time: 02/21/19  2:03 PM  Result Value Ref Range   Rapid Strep A Screen Negative Negative   Objective  Body mass index is 25.95 kg/m. Wt Readings from Last 3 Encounters:  02/24/19 160 lb 12.8 oz (72.9 kg)  02/21/19 159 lb 6.4 oz (72.3 kg)  12/21/18 159 lb 9.6 oz (72.4 kg)   Temp Readings from Last 3 Encounters:  02/24/19 97.7 F (36.5 C) (Oral)  02/21/19 98.1 F (36.7 C) (Oral)  12/21/18 98.7 F (37.1 C) (Oral)   BP Readings from Last 3 Encounters:  02/24/19 128/86  02/21/19 110/86  12/21/18 (!) 130/92   Pulse Readings from Last 3 Encounters:  02/24/19 70  02/21/19 75  12/21/18 63    Physical Exam Vitals signs and nursing note reviewed.  Constitutional:      Appearance: Normal appearance. He is well-developed and well-groomed.  HENT:     Head: Normocephalic and atraumatic.     Comments: Mild fluid in b/l ears     Nose: Nose normal.     Mouth/Throat:     Mouth: Mucous membranes are moist.     Pharynx: Oropharynx  is clear.  Eyes:     Conjunctiva/sclera: Conjunctivae normal.     Pupils: Pupils are equal, round, and reactive to light.  Cardiovascular:     Rate and Rhythm: Normal rate and regular rhythm.     Heart sounds: Normal heart sounds.  Pulmonary:     Effort: Pulmonary effort is normal.     Breath sounds: Normal breath sounds.  Genitourinary:    Prostate: Normal. Not enlarged, not tender and no nodules present.     Rectum: Normal.  Musculoskeletal:     Lumbar back: He exhibits tenderness.  Skin:    General: Skin is warm and dry.  Neurological:     General: No focal deficit present.     Mental Status: He is alert and oriented to person, place, and time. Mental status is at baseline.     Gait: Gait normal.  Psychiatric:        Attention and Perception: Attention and perception normal.        Mood and Affect: Mood and affect normal.        Speech: Speech normal.        Behavior: Behavior normal. Behavior is cooperative.         Thought Content: Thought content normal.        Cognition and Memory: Cognition and memory normal.        Judgment: Judgment normal.     Assessment   1. HTN/HLD  2. URI and ear pain improved with allergies 3. Chronic low back pain with right radiculopathy  4. HM/annual Plan   1.  Same dose consider increase in future pt will monitor  Given cholesterol handout repeat in 6 months cmet, lipid  2. Prn allegra, add singulair, flonase  3. Xray low back today consider MRI in future  4.  Never had flu shot  Check vxs (I.e Tdap) old PCP check date  Consider shingrix vaccine  Needs hep B and MMR vaccine   Check colonoscopy old PCP had x 2 dad h/o polyps precancerous had age 24 and 86 , also had labs 07/2018 -no colonoscopy in records per pt had 2-3 colonoscopies in the past h/o polyp removed small  -? When due for colonoscopy f/u   DRE normal today with nl PSA  Call derm for appt given # today  Never smoker  rec healthy diet and exercise   Provider: Dr. Olivia Mackie McLean-Scocuzza-[facility] Medicine

## 2019-02-24 NOTE — Progress Notes (Signed)
Pre visit review using our clinic review tool, if applicable. No additional management support is needed unless otherwise documented below in the visit note. 

## 2019-02-24 NOTE — Patient Instructions (Addendum)
Flonase 2 sprays as needed max each nose  Take singulair at night   Consider hepatitis B vaccine new vaccines x 2 doses  Consider MMR vaccine   Dermatology Dr. Ricke Hey [phone]   Cholesterol Cholesterol is a white, waxy, fat-like substance that is needed by the human body in small amounts. The liver makes all the cholesterol we need. Cholesterol is carried from the liver by the blood through the blood vessels. Deposits of cholesterol (plaques) may build up on blood vessel (artery) walls. Plaques make the arteries narrower and stiffer. Cholesterol plaques increase the risk for heart attack and stroke. You cannot feel your cholesterol level even if it is very high. The only way to know that it is high is to have a blood test. Once you know your cholesterol levels, you should keep a record of the test results. Work with your health care provider to keep your levels in the desired range. What do the results mean?  Total cholesterol is a rough measure of all the cholesterol in your blood.  LDL (low-density lipoprotein) is the "bad" cholesterol. This is the type that causes plaque to build up on the artery walls. You want this level to be low.  HDL (high-density lipoprotein) is the "good" cholesterol because it cleans the arteries and carries the LDL away. You want this level to be high.  Triglycerides are fat that the body can either burn for energy or store. High levels are closely linked to heart disease. What are the desired levels of cholesterol?  Total cholesterol below 200.  LDL below 100 for people who are at risk, below 70 for people at very high risk.  HDL above 40 is good. A level of 60 or higher is considered to be protective against heart disease.  Triglycerides below 150. How can I lower my cholesterol? Diet Follow your diet program as told by your health care provider.  Choose fish or white meat chicken and [place], roasted or baked. Limit fatty cuts of red meat,  fried foods, and processed meats, such as sausage and lunch meats.  Eat lots of fresh fruits and vegetables.  Choose whole grains, beans, pasta, potatoes, and cereals.  Choose olive oil, corn oil, or canola oil, and use only small amounts.  Avoid butter, mayonnaise, shortening, or palm kernel oils.  Avoid foods with trans fats.  Drink skim or nonfat milk and eat low-fat or nonfat yogurt and cheeses. Avoid whole milk, cream, ice cream, egg yolks, and full-fat cheeses.  Healthier desserts include angel food cake, ginger snaps, animal crackers, hard candy, popsicles, and low-fat or nonfat frozen yogurt. Avoid pastries, cakes, pies, and cookies.  Exercise  Follow your exercise program as told by your health care provider. A regular program: ? Helps to decrease LDL and raise HDL. ? Helps with weight control.  Do things that increase your activity level, such as gardening, walking, and taking the stairs.  Ask your health care provider about ways that you can be more active in your daily life. Medicine  Take over-the-counter and prescription medicines only as told by your health care provider. ? Medicine may be prescribed by your health care provider to help lower cholesterol and decrease the risk for heart disease. This is usually done if diet and exercise have failed to bring down cholesterol levels. ? If you have several risk factors, you may need medicine even if your levels are normal. This information is not intended to replace advice given to you by  your health care provider. Make sure you discuss any questions you have with your health care provider. Document Released: 09/09/2001 Document Revised: 07/12/2016 Document Reviewed: 06/14/2016 Elsevier Interactive Patient Education  [employer].

## 2019-02-25 NOTE — Telephone Encounter (Signed)
Call pt they did not send colonoscopy report nor Tdap verified if he can call them so they can fax those 2 items   Thanks tMS

## 2019-02-27 ENCOUNTER — Other Ambulatory Visit: Payer: Self-pay | Admitting: Internal Medicine

## 2019-02-27 DIAGNOSIS — M25552 Pain in left hip: Principal | ICD-10-CM

## 2019-02-27 DIAGNOSIS — M5136 Other intervertebral disc degeneration, lumbar region: Secondary | ICD-10-CM

## 2019-02-27 DIAGNOSIS — M5416 Radiculopathy, lumbar region: Secondary | ICD-10-CM

## 2019-02-27 DIAGNOSIS — M25551 Pain in right hip: Secondary | ICD-10-CM

## 2019-02-28 NOTE — Telephone Encounter (Signed)
Left message for patient to return call back. P[facility] may give information.  

## 2019-03-22 ENCOUNTER — Ambulatory Visit
Admission: RE | Admit: 2019-03-22 | Discharge: 2019-03-22 | Disposition: A | Discharge status: Home / Self Care | Payer: BLUE CROSS/BLUE SHIELD | Source: Ambulatory Visit | Attending: Internal Medicine | Admitting: Internal Medicine

## 2019-03-22 ENCOUNTER — Other Ambulatory Visit: Payer: Self-pay

## 2019-03-22 ENCOUNTER — Other Ambulatory Visit: Payer: Self-pay | Admitting: Internal Medicine

## 2019-03-22 ENCOUNTER — Encounter: Payer: Self-pay | Admitting: Internal Medicine

## 2019-03-22 ENCOUNTER — Encounter: Payer: BLUE CROSS/BLUE SHIELD | Admitting: Internal Medicine

## 2019-03-22 DIAGNOSIS — M5136 Other intervertebral disc degeneration, lumbar region: Secondary | ICD-10-CM | POA: Insufficient documentation

## 2019-03-22 DIAGNOSIS — M25551 Pain in right hip: Secondary | ICD-10-CM

## 2019-03-22 DIAGNOSIS — M5416 Radiculopathy, lumbar region: Secondary | ICD-10-CM

## 2019-03-22 DIAGNOSIS — M25552 Pain in left hip: Secondary | ICD-10-CM | POA: Insufficient documentation

## 2019-05-17 ENCOUNTER — Ambulatory Visit: Payer: Self-pay

## 2019-05-17 NOTE — Telephone Encounter (Signed)
Patient called and says on last night he found a tick attached to his right thigh with the head embedded in his skin. He says he removed it and placed it in a ziplock bag. He says the tick is the baby size tick, nymph size tick. He says the site looks fine, no redness noted. I asked about tetanus booster, he says he doesn't remember when he received one last. I asked about any other symptoms, he denies. Home care advice given, patient verbalized understanding. He asks does he need a tetanus shot, I advised I will send this to the office to the provider and if she recommends one, then someone will call him back to schedule, he verbalized understanding.   Reason for Disposition . Tick bite with no complications  Answer Assessment - Initial Assessment Questions 1. TYPE of TICK: "Is it a wood tick or a deer tick?" If unsure, ask: "What size was the tick?" "Did it look more like a watermelon seed or a poppy seed?"      The baby size of tick, nymph size 2. LOCATION: "Where is the tick bite located?"      On the back of right thigh found last night, head was in the skin 3. ONSET: "How long do you think the tick was attached before you removed it?" (Hours or days)      I don't know how long it was attached 4. TETANUS: "When was the last tetanus booster?"      Not sure 5. PREGNANCY: "Is there any chance you are pregnant?" "When was your last menstrual period?"     N/A  Protocols used: TICK BITE-A-AH

## 2019-05-17 NOTE — Telephone Encounter (Signed)
Patient called, left VM to return call to the office to speak to a TN.   Message from Pauline Good sent at 05/17/2019 1:43 PM EDT   Pt had a tick on him 5.18.20 but doesn't have any red spots on skin. Pt want to be advise what should he do. Pt does have the tick that bit him.

## 2019-05-17 NOTE — Telephone Encounter (Signed)
Pt was bite by a tick last night and was wondering if he needed to have a tetanus booster. Pt stated that he is not having any symptoms.

## 2019-05-17 NOTE — Telephone Encounter (Signed)
sch appt tomorrow 05/18/2019  [place]

## 2019-05-18 ENCOUNTER — Ambulatory Visit (INDEPENDENT_AMBULATORY_CARE_PROVIDER_SITE_OTHER): Payer: BLUE CROSS/BLUE SHIELD | Admitting: Internal Medicine

## 2019-05-18 ENCOUNTER — Other Ambulatory Visit: Payer: Self-pay

## 2019-05-18 DIAGNOSIS — S70361A Insect bite (nonvenomous), right thigh, initial encounter: Secondary | ICD-10-CM

## 2019-05-18 DIAGNOSIS — I10 Essential (primary) hypertension: Secondary | ICD-10-CM

## 2019-05-18 DIAGNOSIS — W57XXXA Bitten or stung by nonvenomous insect and other nonvenomous arthropods, initial encounter: Secondary | ICD-10-CM

## 2019-05-18 DIAGNOSIS — I959 Hypotension, unspecified: Secondary | ICD-10-CM

## 2019-05-18 DIAGNOSIS — E785 Hyperlipidemia, unspecified: Secondary | ICD-10-CM

## 2019-05-18 MED ORDER — LOSARTAN POTASSIUM 50 MG PO TABS: 50.0000 mg | ORAL_TABLET | Freq: Every day | ORAL | 11 refills | Status: DC

## 2019-05-18 NOTE — Telephone Encounter (Signed)
Left message for patient to call back and schedule a virtual visit for tick bite to be evaluated.  Bekki Tavenner,cma

## 2019-05-18 NOTE — Patient Instructions (Signed)

## 2019-05-18 NOTE — Progress Notes (Signed)
Virtual Visit via Video Note  I connected with Anthony Patton   on 05/18/19 at 11:04 AM EDT by a video enabled telemedicine application and verified that I am speaking with the correct person using two identifiers.  Location patient: home Location provider:work  Persons participating in the virtual visit: patient, provider  I discussed the limitations of evaluation and management by telemedicine and the availability of in person appointments. The patient expressed understanding and agreed to proceed.   HPI: 1. Tick bite right posterior thigh Sunday had teledoc visit with doctor at work and given doxy 100 mg bid and had 1 day of 14 day dose. Tick was not engorged they removed all of the tick. No h/a, fever, rash or joint pain. All of pieces of tick were out and they drew a line around tick bite he likely got the tick working in the yard doing yard work. Area on right thigh is small red not itchy  2. HTN BP running low 101/68 especially when he works out and sweats a lot and he is feeling dizzy. This normally happens in the pm after exercise or bike ride.  He is on hyzaar 50-12.5 and has been on some time  3. Chronic back pain with abnormal MRI back appt with Dr. Sharlet Salina moved back to 06/03/2019 due to COVID 19   ROS: See pertinent positives and negatives per HPI.  Past Medical History:  Diagnosis Date  . Allergy   . Chicken pox   . Colon polyps   . Gastritis    with esophagitis   . GERD (gastroesophageal reflux disease)   . Hyperlipidemia   . Hypertension   . Low back pain     Past Surgical History:  Procedure Laterality Date  . APPENDECTOMY    . COLONOSCOPY W/ POLYPECTOMY    . KNEE SURGERY     arthroscopy x 2 torn cartilage   . LASIK     b/l  . TONSILLECTOMY      Family History  Problem Relation Age of Onset  . Diabetes Mother   . Hypertension Mother   . Diabetes Father   . Hypertension Father   . Hyperlipidemia Father   . Diabetes Maternal Grandmother   . Heart  disease Maternal Grandfather        MI age 17s-50s   . Hyperlipidemia Paternal Grandmother   . Diabetes Paternal Grandfather   . Cancer Paternal Grandfather        lung cancer smoker     SOCIAL HX: married with kids   Current Outpatient Medications:  .  fexofenadine (ALLEGRA) 180 MG tablet, Take 180 mg by mouth daily as needed for allergies or rhinitis., Disp: , Rfl:  .  losartan (COZAAR) 50 MG tablet, Take 1 tablet (50 mg total) by mouth daily., Disp: 30 tablet, Rfl: 11 .  losartan-hydrochlorothiazide (HYZAAR) 50-12.5 MG tablet, Take 1 tablet by mouth daily., Disp: 90 tablet, Rfl: 3 .  montelukast (SINGULAIR) 10 MG tablet, Take 1 tablet (10 mg total) by mouth at bedtime., Disp: 30 tablet, Rfl: 0 .  pantoprazole (PROTONIX) 40 MG tablet, Take 1 tablet (40 mg total) by mouth daily. 30 minutes before food, Disp: 90 tablet, Rfl: 3  EXAM:  VITALS per patient if applicable:  GENERAL: alert, oriented, appears well and in no acute distress  HEENT: atraumatic, conjunttiva clear, no obvious abnormalities on inspection of external nose and ears  NECK: normal movements of the head and neck  LUNGS: on inspection no signs of respiratory  distress, breathing rate appears normal, no obvious gross SOB, gasping or wheezing  CV: no obvious cyanosis  MS: moves all visible extremities without noticeable abnormality  PSYCH/NEURO: pleasant and cooperative, no obvious depression or anxiety, speech and thought processing grossly intact  Skin right posterior thigh tick bite   ASSESSMENT AND PLAN:  Discussed the following assessment and plan:  Essential hypertension - Plan: losartan (COZAAR) 50 MG tablet hold losartan 50 hctz 12.5 for now and if needed will add back prn hctz 12.5 BP goal <130/<80  Keep hydrated with water  Hypotension, unspecified hypotension type at times with dizziness see above  Tick bite, initial encounter -has Rx Doxycycline 100 mg bid to take 2 week supply advised pt can  take for 7-10 days tick did not seem engorged/attached to him for long   HM Never had flu shot  Check vxs (I.e Tdap) old PCP check date  Consider shingrix vaccine  Needs hep B and MMR vaccine   Check colonoscopy old PCP had x 2 dad h/o polyps precancerous had age 71 and 89 , also had labs 07/2018 -no colonoscopy in recordsper pt had 2-3 colonoscopies in the past h/o polyp removed small  -? When due for colonoscopy f/u PCP records cant find last report but possibly had last colonoscopy age 82 with h/o polyps  Refer in future after COVID repeat colonoscopy   DRE normal 02/24/2019 with nl PSA 0.80 Call derm for appt given # today  Never smoker  rec healthy diet and exercise  Dr. Sharlet Salina appt 06/03/2019  Recheck lipid and CMET 08/25/2019  I discussed the assessment and treatment plan with the patient. The patient was provided an opportunity to ask questions and all were answered. The patient agreed with the plan and demonstrated an understanding of the instructions.   The patient was advised to call back or seek an in-person evaluation if the symptoms worsen or if the condition fails to improve as anticipated.  Time spent 15 minutes Delorise Jackson, MD

## 2019-06-03 ENCOUNTER — Encounter: Payer: Self-pay | Admitting: Internal Medicine

## 2019-06-14 ENCOUNTER — Other Ambulatory Visit: Payer: Self-pay | Admitting: Internal Medicine

## 2019-06-14 ENCOUNTER — Encounter: Payer: Self-pay | Admitting: Internal Medicine

## 2019-06-14 DIAGNOSIS — M1990 Unspecified osteoarthritis, unspecified site: Secondary | ICD-10-CM

## 2019-06-14 MED ORDER — NAPROXEN 500 MG PO TABS: 500.0000 mg | ORAL_TABLET | Freq: Two times a day (BID) | ORAL | 5 refills | Status: DC | PRN

## 2019-06-14 MED ORDER — NAPROXEN 500 MG PO TABS: 500.0000 mg | ORAL_TABLET | Freq: Two times a day (BID) | ORAL | 5 refills | Status: DC

## 2019-07-11 ENCOUNTER — Encounter: Payer: Self-pay | Admitting: Internal Medicine

## 2019-07-27 ENCOUNTER — Telehealth (INDEPENDENT_AMBULATORY_CARE_PROVIDER_SITE_OTHER): Payer: Managed Care, Other (non HMO) | Admitting: Internal Medicine

## 2019-07-27 ENCOUNTER — Other Ambulatory Visit: Payer: Self-pay

## 2019-07-27 DIAGNOSIS — I959 Hypotension, unspecified: Secondary | ICD-10-CM | POA: Diagnosis not present

## 2019-07-27 DIAGNOSIS — E785 Hyperlipidemia, unspecified: Secondary | ICD-10-CM | POA: Diagnosis not present

## 2019-07-27 DIAGNOSIS — R42 Dizziness and giddiness: Secondary | ICD-10-CM

## 2019-07-27 DIAGNOSIS — I10 Essential (primary) hypertension: Secondary | ICD-10-CM

## 2019-07-27 DIAGNOSIS — R55 Syncope and collapse: Secondary | ICD-10-CM

## 2019-07-27 MED ORDER — LOSARTAN POTASSIUM-HCTZ 50-12.5 MG PO TABS: 0.5000 | ORAL_TABLET | Freq: Every day | ORAL | 3 refills | Status: DC

## 2019-07-27 NOTE — Progress Notes (Signed)
Virtual Visit via Video Note  I connected with Anthony Patton   on 07/27/19 at  3:30 PM EDT by a video enabled telemedicine application and verified that I am speaking with the correct person using two identifiers.  Location patient: home Location provider:work  Persons participating in the virtual visit: patient, provider  I discussed the limitations of evaluation and management by telemedicine and the availability of in person appointments. The patient expressed understanding and agreed to proceed.   HPI: 1. HTN BP in the am 130s-140s/70s-90s and at night 106-110/60s-70s he is feeling dizzy and lightheaded and sluggish when BP runs on the lower side he is taking losartan 50 hctz 12.5 and 1/2 of this dose to see if this would help with dizziness. He did have an episode of LOC with low BP w/in the last few weeks at that time he cut the BP pill in 1/2. When he exercises he feels dizzy. He is taking his BP meds at night   He likes to jog 2-3 x per week and stopped due to LBP noted on MRI but has tried to walk more and increase water intake when he exercises he does sweat a lot and feels with more exertion his BP goes lower   He reports when taking the dose hyzaar 50-12.5 from [facility] in [place] made by [place] he did not have these sx's and thinks it could be due to a different pill called [facility] and same dose of the medication is made by [employer] prior to adding hctz 12.5 he was on losartan 50 mg qd but BP was not controlled   No n/v/vertigo    ROS: See pertinent positives and negatives per HPI.  Past Medical History:  Diagnosis Date  . Allergy   . Chicken pox   . Colon polyps   . Gastritis    with esophagitis   . GERD (gastroesophageal reflux disease)   . Hyperlipidemia   . Hypertension   . Low back pain     Past Surgical History:  Procedure Laterality Date  . APPENDECTOMY    . COLONOSCOPY W/ POLYPECTOMY    . KNEE SURGERY     arthroscopy x 2 torn cartilage   .  LASIK     b/l  . TONSILLECTOMY      Family History  Problem Relation Age of Onset  . Diabetes Mother   . Hypertension Mother   . Diabetes Father   . Hypertension Father   . Hyperlipidemia Father   . Diabetes Maternal Grandmother   . Heart disease Maternal Grandfather        MI age 72s-50s   . Hyperlipidemia Paternal Grandmother   . Diabetes Paternal Grandfather   . Cancer Paternal Grandfather        lung cancer smoker     SOCIAL HX: married with kids    Current Outpatient Medications:  .  fexofenadine (ALLEGRA) 180 MG tablet, Take 180 mg by mouth daily as needed for allergies or rhinitis., Disp: , Rfl:  .  losartan-hydrochlorothiazide (HYZAAR) 50-12.5 MG tablet, Take 0.5 tablets by mouth daily., Disp: 90 tablet, Rfl: 3 .  montelukast (SINGULAIR) 10 MG tablet, Take 1 tablet (10 mg total) by mouth at bedtime., Disp: 30 tablet, Rfl: 0 .  naproxen (NAPROSYN) 500 MG tablet, Take 1 tablet (500 mg total) by mouth 2 (two) times daily as needed. With food, Disp: 60 tablet, Rfl: 5 .  pantoprazole (PROTONIX) 40 MG tablet, Take 1 tablet (40 mg total)  by mouth daily. 30 minutes before food, Disp: 90 tablet, Rfl: 3  EXAM:  VITALS per patient if applicable:  GENERAL: alert, oriented, appears well and in no acute distress  HEENT: atraumatic, conjunttiva clear, no obvious abnormalities on inspection of external nose and ears  NECK: normal movements of the head and neck  LUNGS: on inspection no signs of respiratory distress, breathing rate appears normal, no obvious gross SOB, gasping or wheezing  CV: no obvious cyanosis  MS: moves all visible extremities without noticeable abnormality  PSYCH/NEURO: pleasant and cooperative, no obvious depression or anxiety, speech and thought processing grossly intact  ASSESSMENT AND PLAN:  Discussed the following assessment and plan:  Hypotension, unspecified hypotension type - Plan: Comprehensive metabolic panel, Cortisol, ECHOCARDIOGRAM  COMPLETE  Essential hypertension - Plan: Comprehensive metabolic panel, ECHOCARDIOGRAM COMPLETE, losartan-hydrochlorothiazide (HYZAAR) 50-12.5 MG tablet he is taking 1/2 of this dose rec he take this in the am and monitor BP in am and pm  Hyperlipidemia, unspecified hyperlipidemia type - Plan: Lipid panel  Syncope, unspecified syncope type - Plan: ECHOCARDIOGRAM COMPLETE Dizziness  -consider MRI of brain in future if w/u negative   Will check orthostatics   HM Never had flu shot  Check vxs (I.e Tdap) old PCPcheck date Consider shingrix vaccine Needs hep B and MMR vaccine  Check colonoscopy old PCP had x 2 dad h/o polypsprecancerous had age 71 and 37 , also had labs 07/2018 -no colonoscopy in recordsper pt had 2-3 colonoscopies in the past h/o polyp removed small  -? When due for colonoscopy f/u PCP records cant find last report but possibly had last colonoscopy age 2 with h/o polyps  Refer in future after COVID repeat colonoscopy   DRE normal 02/24/2019 with nl PSA 0.80 Call derm for appt given # today  Never smoker  rec healthy diet and exercise Dr. Sharlet Salina appt 06/03/2019   I discussed the assessment and treatment plan with the patient. The patient was provided an opportunity to ask questions and all were answered. The patient agreed with the plan and demonstrated an understanding of the instructions.   The patient was advised to call back or seek an in-person evaluation if the symptoms worsen or if the condition fails to improve as anticipated.  Time spent 25 minutes  Delorise Jackson, MD

## 2019-07-28 ENCOUNTER — Telehealth: Payer: Self-pay | Admitting: Internal Medicine

## 2019-07-28 ENCOUNTER — Encounter: Payer: Self-pay | Admitting: Internal Medicine

## 2019-07-28 NOTE — Telephone Encounter (Signed)
sch orthostatics when pt comes in for labs   Thanks tMS

## 2019-07-28 NOTE — Patient Instructions (Signed)
Dizziness Dizziness is a common problem. It is a feeling of unsteadiness or light-headedness. You may feel like you are about to faint. Dizziness can lead to injury if you stumble or fall. Anyone can become dizzy, but dizziness is more common in older adults. This condition can be caused by a number of things, including medicines, dehydration, or illness. Follow these instructions at home: Eating and drinking  Drink enough fluid to keep your urine clear or pale yellow. This helps to keep you from becoming dehydrated. Try to drink more clear fluids, such as water.  Do not drink alcohol.  Limit your caffeine intake if told to do so by your health care provider. Check ingredients and nutrition facts to see if a food or beverage contains caffeine.  Limit your salt (sodium) intake if told to do so by your health care provider. Check ingredients and nutrition facts to see if a food or beverage contains sodium. Activity  Avoid making quick movements. ? Rise slowly from chairs and steady yourself until you feel okay. ? In the morning, first sit up on the side of the bed. When you feel okay, stand slowly while you hold onto something until you know that your balance is fine.  If you need to stand in one place for a long time, move your legs often. Tighten and relax the muscles in your legs while you are standing.  Do not drive or use heavy machinery if you feel dizzy.  Avoid bending down if you feel dizzy. Place items in your home so that they are easy for you to reach without leaning over. Lifestyle  Do not use any products that contain nicotine or tobacco, such as cigarettes and e-cigarettes. If you need help quitting, ask your health care provider.  Try to reduce your stress level by using methods such as yoga or meditation. Talk with your health care provider if you need help to manage your stress. General instructions  Watch your dizziness for any changes.  Take over-the-counter and  prescription medicines only as told by your health care provider. Talk with your health care provider if you think that your dizziness is caused by a medicine that you are taking.  Tell a friend or a family member that you are feeling dizzy. If he or she notices any changes in your behavior, have this person call your health care provider.  Keep all follow-up visits as told by your health care provider. This is important. Contact a health care provider if:  Your dizziness does not go away.  Your dizziness or light-headedness gets worse.  You feel nauseous.  You have reduced hearing.  You have new symptoms.  You are unsteady on your feet or you feel like the room is spinning. Get help right away if:  You vomit or have diarrhea and are unable to eat or drink anything.  You have problems talking, walking, swallowing, or using your arms, hands, or legs.  You feel generally weak.  You are not thinking clearly or you have trouble forming sentences. It may take a friend or family member to notice this.  You have chest pain, abdominal pain, shortness of breath, or sweating.  Your vision changes.  You have any bleeding.  You have a severe headache.  You have neck pain or a stiff neck.  You have a fever. These symptoms may represent a serious problem that is an emergency. Do not wait to see if the symptoms will go away. Get medical help   right away. Call your local emergency services (911 in the [place] Do not drive yourself to the hospital. Summary  Dizziness is a feeling of unsteadiness or light-headedness. This condition can be caused by a number of things, including medicines, dehydration, or illness.  Anyone can become dizzy, but dizziness is more common in older adults.  Drink enough fluid to keep your urine clear or pale yellow. Do not drink alcohol.  Avoid making quick movements if you feel dizzy. Monitor your dizziness for any changes. This information is not intended to  replace advice given to you by your health care provider. Make sure you discuss any questions you have with your health care provider. Document Released: 06/10/2001 Document Revised: 12/18/2017 Document Reviewed: 01/17/2017 Elsevier Patient Education  2020 [employer].

## 2019-08-04 ENCOUNTER — Other Ambulatory Visit: Payer: Managed Care, Other (non HMO)

## 2019-08-10 ENCOUNTER — Ambulatory Visit: Payer: Managed Care, Other (non HMO)

## 2019-08-16 ENCOUNTER — Ambulatory Visit: Payer: Managed Care, Other (non HMO)

## 2019-08-22 ENCOUNTER — Encounter: Payer: Self-pay | Admitting: Internal Medicine

## 2019-08-23 ENCOUNTER — Telehealth: Payer: Self-pay

## 2019-08-23 NOTE — Telephone Encounter (Signed)
Copied from [facility] [id number]. Topic: General - Other >> Aug 23, 2019 10:04 AM Rayann Heman wrote: Reason for CRM: pt called and stated that he would like to have labs done at [facility] Thursday morning and they will need a lab. Please advise

## 2019-08-23 NOTE — Telephone Encounter (Signed)
He is supposed to have lab orders in our office messaged back yesterday  [place]

## 2019-08-23 NOTE — Telephone Encounter (Signed)
Patient returned Anthony Patton's call.    Patient said that it's too late in the morning to give a fasting lab at 8:45 am because he has a tendency to pass out.  Patient needs orders to go to [facility] [place], [place].  Pt's appt is Thursday morning at 7:30 am.

## 2019-08-23 NOTE — Telephone Encounter (Signed)
Pt needing lab orders for [facility]

## 2019-08-23 NOTE — Telephone Encounter (Signed)
Left message for patient to call back  

## 2019-08-23 NOTE — Telephone Encounter (Signed)
Labs need to be reordered for [facility]

## 2019-08-25 ENCOUNTER — Other Ambulatory Visit: Payer: BLUE CROSS/BLUE SHIELD

## 2019-08-25 NOTE — Telephone Encounter (Signed)
Patient called stating he was at [facility] this morning but couldn't get his blood drawn because they didn't have the lab orders.  Patient would like for lab orders to be resent and would like to pick up a copy of lab orders today between 12-1pm.    PCP aware.  PCP printed off copy for patient.  Lab orders left at front desk for patient to pick up.  MyChart message sent.

## 2019-08-25 NOTE — Addendum Note (Signed)
Addended by: Orland Mustard on: 08/25/2019 08:43 AM   Modules accepted: Orders

## 2019-08-26 ENCOUNTER — Ambulatory Visit
Admission: RE | Admit: 2019-08-26 | Discharge: 2019-08-26 | Disposition: A | Discharge status: Home / Self Care | Payer: Managed Care, Other (non HMO) | Source: Ambulatory Visit | Attending: Internal Medicine | Admitting: Internal Medicine

## 2019-08-26 ENCOUNTER — Other Ambulatory Visit: Payer: Self-pay

## 2019-08-26 ENCOUNTER — Encounter: Payer: Self-pay | Admitting: Internal Medicine

## 2019-08-26 ENCOUNTER — Other Ambulatory Visit: Payer: Managed Care, Other (non HMO)

## 2019-08-26 DIAGNOSIS — I959 Hypotension, unspecified: Secondary | ICD-10-CM | POA: Diagnosis not present

## 2019-08-26 DIAGNOSIS — E785 Hyperlipidemia, unspecified: Secondary | ICD-10-CM | POA: Insufficient documentation

## 2019-08-26 DIAGNOSIS — I10 Essential (primary) hypertension: Secondary | ICD-10-CM | POA: Insufficient documentation

## 2019-08-26 DIAGNOSIS — R55 Syncope and collapse: Secondary | ICD-10-CM | POA: Insufficient documentation

## 2019-08-26 NOTE — Progress Notes (Signed)
*  PRELIMINARY RESULTS* Echocardiogram 2D Echocardiogram has been performed.  Sherrie Sport 08/26/2019, 10:56 AM

## 2019-08-30 ENCOUNTER — Other Ambulatory Visit: Payer: Self-pay | Admitting: Internal Medicine

## 2019-08-30 DIAGNOSIS — I10 Essential (primary) hypertension: Secondary | ICD-10-CM

## 2019-08-30 DIAGNOSIS — G8929 Other chronic pain: Secondary | ICD-10-CM

## 2019-08-30 DIAGNOSIS — R42 Dizziness and giddiness: Secondary | ICD-10-CM

## 2019-08-30 MED ORDER — AMLODIPINE BESYLATE 2.5 MG PO TABS: 2.5000 mg | ORAL_TABLET | Freq: Every day | ORAL | 3 refills | Status: DC

## 2019-08-30 MED ORDER — LOSARTAN POTASSIUM 25 MG PO TABS: 25.0000 mg | ORAL_TABLET | Freq: Every day | ORAL | 3 refills | Status: DC

## 2019-09-02 ENCOUNTER — Other Ambulatory Visit: Payer: Self-pay | Admitting: Internal Medicine

## 2019-09-02 DIAGNOSIS — F329 Major depressive disorder, single episode, unspecified: Secondary | ICD-10-CM

## 2019-09-02 DIAGNOSIS — F419 Anxiety disorder, unspecified: Secondary | ICD-10-CM

## 2019-09-02 MED ORDER — SERTRALINE HCL 25 MG PO TABS: 25.0000 mg | ORAL_TABLET | Freq: Every day | ORAL | 2 refills | Status: DC

## 2019-09-12 ENCOUNTER — Ambulatory Visit
Admission: RE | Admit: 2019-09-12 | Discharge: 2019-09-12 | Disposition: A | Discharge status: Home / Self Care | Payer: Managed Care, Other (non HMO) | Source: Ambulatory Visit | Attending: Internal Medicine | Admitting: Internal Medicine

## 2019-09-12 ENCOUNTER — Other Ambulatory Visit: Payer: Self-pay

## 2019-09-12 DIAGNOSIS — R51 Headache: Secondary | ICD-10-CM | POA: Insufficient documentation

## 2019-09-12 DIAGNOSIS — R42 Dizziness and giddiness: Secondary | ICD-10-CM | POA: Diagnosis present

## 2019-09-12 DIAGNOSIS — G8929 Other chronic pain: Secondary | ICD-10-CM

## 2019-09-13 ENCOUNTER — Other Ambulatory Visit: Payer: Self-pay | Admitting: Internal Medicine

## 2019-09-13 ENCOUNTER — Encounter: Payer: Self-pay | Admitting: Internal Medicine

## 2019-09-13 DIAGNOSIS — I10 Essential (primary) hypertension: Secondary | ICD-10-CM

## 2019-09-13 MED ORDER — LOSARTAN POTASSIUM 50 MG PO TABS: 50.0000 mg | ORAL_TABLET | Freq: Every day | ORAL | 3 refills | Status: DC

## 2019-09-14 ENCOUNTER — Encounter: Payer: Self-pay | Admitting: Internal Medicine

## 2019-09-14 LAB — COMPREHENSIVE METABOLIC PANEL
ALT: 28 IU/L (ref 0–44)
AST: 25 IU/L (ref 0–40)
Albumin/Globulin Ratio: 2 (ref 1.2–2.2)
Albumin: 4.5 g/dL (ref 4.0–5.0)
Alkaline Phosphatase: 90 IU/L (ref 39–117)
BUN/Creatinine Ratio: 12 (ref 9–20)
BUN: 15 mg/dL (ref 6–24)
Bilirubin Total: 0.8 mg/dL (ref 0.0–1.2)
CO2: 26 mmol/L (ref 20–29)
Calcium: 9.7 mg/dL (ref 8.7–10.2)
Chloride: 103 mmol/L (ref 96–106)
Creatinine, Ser: 1.3 mg/dL — ABNORMAL HIGH (ref 0.76–1.27)
GFR calc Af Amer: 74 mL/min/{1.73_m2} (ref >59)
GFR calc non Af Amer: 64 mL/min/{1.73_m2} (ref >59)
Globulin, Total: 2.2 g/dL (ref 1.5–4.5)
Glucose: 90 mg/dL (ref 65–99)
Potassium: 4.8 mmol/L (ref 3.5–5.2)
Sodium: 143 mmol/L (ref 134–144)
Total Protein: 6.7 g/dL (ref 6.0–8.5)

## 2019-09-14 LAB — LIPID PANEL
Chol/HDL Ratio: 4.9 ratio (ref 0.0–5.0)
Cholesterol, Total: 209 mg/dL — ABNORMAL HIGH (ref 100–199)
HDL: 43 mg/dL (ref >39)
LDL Chol Calc (NIH): 142 mg/dL — ABNORMAL HIGH (ref 0–99)
Triglycerides: 131 mg/dL (ref 0–149)
VLDL Cholesterol Cal: 24 mg/dL (ref 5–40)

## 2019-09-14 LAB — CORTISOL: Cortisol: 17.9 ug/dL

## 2019-09-23 ENCOUNTER — Other Ambulatory Visit: Payer: Self-pay

## 2019-09-23 ENCOUNTER — Ambulatory Visit (INDEPENDENT_AMBULATORY_CARE_PROVIDER_SITE_OTHER): Payer: Managed Care, Other (non HMO) | Admitting: Family Medicine

## 2019-09-23 DIAGNOSIS — H938X3 Other specified disorders of ear, bilateral: Secondary | ICD-10-CM | POA: Diagnosis not present

## 2019-09-23 DIAGNOSIS — J309 Allergic rhinitis, unspecified: Secondary | ICD-10-CM

## 2019-09-23 NOTE — Progress Notes (Signed)
Patient ID: Anthony Patton, male   DOB: Dec 21, 1971, 48 y.o.   MRN: [id number]    Virtual Visit via video Note  This visit type was conducted due to national recommendations for restrictions regarding the COVID-19 pandemic (e.g. social distancing).  This format is felt to be most appropriate for this patient at this time.  All issues noted in this document were discussed and addressed.  No physical exam was performed (except for noted visual exam findings with Video Visits).   I connected with Neoma Laming today at  4:00 PM EDT by a video enabled telemedicine application and verified that I am speaking with the correct person using two identifiers. Location patient: home Location provider: work or home office Persons participating in the virtual visit: patient, provider  I discussed the limitations, risks, security and privacy concerns of performing an evaluation and management service by video and the availability of in person appointments. I also discussed with the patient that there may be a patient responsible charge related to this service. The patient expressed understanding and agreed to proceed.   HPI:  Patient and I connected via video to discuss complaint of some sore throat, ear congestion and drainage on back of throat.  Patient reports a history of seasonal allergies.  Recently traveled to [place], their weather has been cooler in the evenings and more leaves her on the ground and this seems to set off his allergies.  Patient states he does take Allegra daily.  Does not use any other nasal spray.  States he is prone to ear infections and is concerned he may be getting an ear infection.  States the ear pain is been present now for about a week, but seems to subside over the last 2 days.  No fever or chills.  No hearing loss.  No difficulty swallowing or thick yellow drainage from sinuses.  No sharp headache pain.  No cough, shortness of breathing, or chest pain.  No body  aches.   ROS: See pertinent positives and negatives per HPI.  Past Medical History:  Diagnosis Date   Allergy    Chicken pox    Colon polyps    Gastritis    with esophagitis    GERD (gastroesophageal reflux disease)    Hyperlipidemia    Hypertension    Low back pain     Past Surgical History:  Procedure Laterality Date   APPENDECTOMY     COLONOSCOPY W/ POLYPECTOMY     KNEE SURGERY     arthroscopy x 2 torn cartilage    LASIK     b/l   TONSILLECTOMY      Family History  Problem Relation Age of Onset   Diabetes Mother    Hypertension Mother    Diabetes Father    Hypertension Father    Hyperlipidemia Father    Diabetes Maternal Grandmother    Heart disease Maternal Grandfather        MI age 48s-50s    Hyperlipidemia Paternal Grandmother    Diabetes Paternal Grandfather    Cancer Paternal Grandfather        lung cancer smoker     Current Outpatient Medications:    amLODipine (NORVASC) 2.5 MG tablet, Take 1 tablet (2.5 mg total) by mouth daily. For BP >130/>80, Disp: 90 tablet, Rfl: 3   fexofenadine (ALLEGRA) 180 MG tablet, Take 180 mg by mouth daily as needed for allergies or rhinitis., Disp: , Rfl:    losartan (COZAAR) 50 MG tablet, Take 1 tablet (  50 mg total) by mouth daily., Disp: 90 tablet, Rfl: 3   montelukast (SINGULAIR) 10 MG tablet, Take 1 tablet (10 mg total) by mouth at bedtime., Disp: 30 tablet, Rfl: 0   naproxen (NAPROSYN) 500 MG tablet, Take 1 tablet (500 mg total) by mouth 2 (two) times daily as needed. With food, Disp: 60 tablet, Rfl: 5   pantoprazole (PROTONIX) 40 MG tablet, Take 1 tablet (40 mg total) by mouth daily. 30 minutes before food, Disp: 90 tablet, Rfl: 3   sertraline (ZOLOFT) 25 MG tablet, Take 1 tablet (25 mg total) by mouth daily., Disp: 30 tablet, Rfl: 2  EXAM:  GENERAL: alert, oriented, appears well and in no acute distress  HEENT: atraumatic, conjunttiva clear, no obvious abnormalities on inspection of  external nose and ears  NECK: normal movements of the head and neck  LUNGS: on inspection no signs of respiratory distress, breathing rate appears normal, no obvious gross SOB, gasping or wheezing  CV: no obvious cyanosis  MS: moves all visible extremities without noticeable abnormality  PSYCH/NEURO: pleasant and cooperative, no obvious depression or anxiety, speech and thought processing grossly intact  ASSESSMENT AND PLAN:  Discussed the following assessment and plan:  Discussed with patient that his symptoms are most likely consistent with a flareup of seasonal allergies.  Advised to continue allergy medication once daily either Allegra, Zyrtec or Claritin and use a Flonase or Afrin nasal spray every day as well.  Advised that allergies are chronic and do not usually go away, we have to be diligent with treatment of them throughout the season.  Advised patient that I do not feel he needs antibiotic at this time.  Patient is concerned because he is "prone" to ear infections.  Advised patient that if his symptoms worsen even with the use of daily allergy medicine and daily nasal spray, we can have him come into office we can look into his ears.  Encouraged patient to be consistent daily with these medications as they well to open up drainage from ear nose and throat.  1. Allergic rhinitis, unspecified seasonality, unspecified trigger   2. Ear fullness, bilateral   I discussed the assessment and treatment plan with the patient. The patient was provided an opportunity to ask questions and all were answered. The patient agreed with the plan and demonstrated an understanding of the instructions.   The patient was advised to call back or seek an in-person evaluation if the symptoms worsen or if the condition fails to improve as anticipated.  I provided 15 minutes of video-face-to-face time during this encounter.   Jodelle Green, FNP

## 2019-09-27 ENCOUNTER — Ambulatory Visit (INDEPENDENT_AMBULATORY_CARE_PROVIDER_SITE_OTHER): Payer: Managed Care, Other (non HMO) | Admitting: Internal Medicine

## 2019-09-27 ENCOUNTER — Ambulatory Visit: Payer: BLUE CROSS/BLUE SHIELD | Admitting: Internal Medicine

## 2019-09-27 ENCOUNTER — Other Ambulatory Visit: Payer: Self-pay

## 2019-09-27 VITALS — BP 125/70

## 2019-09-27 DIAGNOSIS — Z1283 Encounter for screening for malignant neoplasm of skin: Secondary | ICD-10-CM | POA: Diagnosis not present

## 2019-09-27 DIAGNOSIS — R42 Dizziness and giddiness: Secondary | ICD-10-CM | POA: Diagnosis not present

## 2019-09-27 DIAGNOSIS — F32A Depression, unspecified: Secondary | ICD-10-CM

## 2019-09-27 DIAGNOSIS — F419 Anxiety disorder, unspecified: Secondary | ICD-10-CM

## 2019-09-27 DIAGNOSIS — I10 Essential (primary) hypertension: Secondary | ICD-10-CM | POA: Diagnosis not present

## 2019-09-27 DIAGNOSIS — F329 Major depressive disorder, single episode, unspecified: Secondary | ICD-10-CM

## 2019-09-27 NOTE — Progress Notes (Signed)
Virtual Visit via Video Note  I connected with Anthony Patton  on 09/28/19 at  4:40 PM EDT by a video enabled telemedicine application and verified that I am speaking with the correct person using two identifiers.  Location patient: home Location provider:work or home office Persons participating in the virtual visit: patient, provider  I discussed the limitations of evaluation and management by telemedicine and the availability of in person appointments. The patient expressed understanding and agreed to proceed.   HPI: 1. Dizziness improved after stopping hctz echo neg, MRI brain neg and cortisol normal although he still reports feeling some lightheadness in the evening it is better  2. HTN on losartan 50 mg and not taking norvasc 2.5 as of 2 days ago  3. Depression/anxiety stopped zoloft 25 mg qd medication not working and making mood worse last week and feeling better and also reduced sex drive     ROS: See pertinent positives and negatives per HPI.  Past Medical History:  Diagnosis Date  . Allergy   . Chicken pox   . Colon polyps   . Gastritis    with esophagitis   . GERD (gastroesophageal reflux disease)   . Hyperlipidemia   . Hypertension   . Low back pain     Past Surgical History:  Procedure Laterality Date  . APPENDECTOMY    . COLONOSCOPY W/ POLYPECTOMY    . KNEE SURGERY     arthroscopy x 2 torn cartilage   . LASIK     b/l  . TONSILLECTOMY      Family History  Problem Relation Age of Onset  . Diabetes Mother   . Hypertension Mother   . Diabetes Father   . Hypertension Father   . Hyperlipidemia Father   . Diabetes Maternal Grandmother   . Heart disease Maternal Grandfather        MI age 77s-50s   . Hyperlipidemia Paternal Grandmother   . Diabetes Paternal Grandfather   . Cancer Paternal Grandfather        lung cancer smoker     SOCIAL HX: married with kids   Current Outpatient Medications:  .  fexofenadine (ALLEGRA) 180 MG tablet, Take 180 mg by  mouth daily as needed for allergies or rhinitis., Disp: , Rfl:  .  losartan (COZAAR) 50 MG tablet, Take 1 tablet (50 mg total) by mouth daily., Disp: 90 tablet, Rfl: 3 .  montelukast (SINGULAIR) 10 MG tablet, Take 1 tablet (10 mg total) by mouth at bedtime., Disp: 30 tablet, Rfl: 0 .  naproxen (NAPROSYN) 500 MG tablet, Take 1 tablet (500 mg total) by mouth 2 (two) times daily as needed. With food, Disp: 60 tablet, Rfl: 5 .  pantoprazole (PROTONIX) 40 MG tablet, Take 1 tablet (40 mg total) by mouth daily. 30 minutes before food, Disp: 90 tablet, Rfl: 3 .  amLODipine (NORVASC) 2.5 MG tablet, Take 1 tablet (2.5 mg total) by mouth daily. For BP >130/>80 (Patient not taking: Reported on 09/28/2019), Disp: 90 tablet, Rfl: 3  EXAM:  VITALS per patient if applicable:  GENERAL: alert, oriented, appears well and in no acute distress  HEENT: atraumatic, conjunttiva clear, no obvious abnormalities on inspection of external nose and ears  NECK: normal movements of the head and neck  LUNGS: on inspection no signs of respiratory distress, breathing rate appears normal, no obvious gross SOB, gasping or wheezing  CV: no obvious cyanosis  MS: moves all visible extremities without noticeable abnormality  PSYCH/NEURO: pleasant and cooperative, no obvious  depression or anxiety, speech and thought processing grossly intact  ASSESSMENT AND PLAN:  Discussed the following assessment and plan:  Essential hypertension-cont losartan 50 mg qd and norvasc 2.5 mg if BP >130/>80 prn   Anxiety and depression-pt wants to stop zoloft 25 due to side effects see HPI Disc osman therapy  Wellbutrin, L theanine also could consider buspar if anxiety is problem he is c/w sexual side effects of meds  He will also speak with mom about meds she has tried and get back with me  Lightheadedness improved   HM Never had flu shot  Check vxs (I.e Tdap) old PCPcheck date Consider shingrix vaccine Needs hep B and MMR  vaccine  Check colonoscopy old PCP had x 2 dad h/o polypsprecancerous had age 14 and 32 , also had labs 07/2018 -no colonoscopy in recordsper pt had 2-3 colonoscopies in the past h/o polyp removed small  -? When due for colonoscopy f/uPCP records cant find last report but possibly had last colonoscopy age 35 with h/o polyps  Refer in future after COVID repeat colonoscopy  DRE normal2/27/2020with nl PSA 0.80 Derm referral placed today  Never smoker  rec healthy diet and exercise   Dr. Sharlet Salina had appt 06/03/2019    -we discussed possible serious and likely etiologies, options for evaluation and workup, limitations of telemedicine visit vs in person visit, treatment, treatment risks and precautions. Pt prefers to treat via telemedicine empirically rather then risking or undertaking an in person visit at this moment. Patient agrees to seek prompt in person care if worsening, new symptoms arise, or if is not improving with treatment.   I discussed the assessment and treatment plan with the patient. The patient was provided an opportunity to ask questions and all were answered. The patient agreed with the plan and demonstrated an understanding of the instructions.   The patient was advised to call back or seek an in-person evaluation if the symptoms worsen or if the condition fails to improve as anticipated.  Time 15 minutes  Delorise Jackson, MD

## 2019-09-28 ENCOUNTER — Encounter: Payer: Self-pay | Admitting: Internal Medicine

## 2019-10-26 ENCOUNTER — Other Ambulatory Visit: Payer: Self-pay | Admitting: Internal Medicine

## 2019-10-26 ENCOUNTER — Encounter: Payer: Self-pay | Admitting: Internal Medicine

## 2019-10-26 DIAGNOSIS — N529 Male erectile dysfunction, unspecified: Secondary | ICD-10-CM

## 2019-10-26 DIAGNOSIS — F419 Anxiety disorder, unspecified: Secondary | ICD-10-CM

## 2019-10-26 DIAGNOSIS — F329 Major depressive disorder, single episode, unspecified: Secondary | ICD-10-CM

## 2019-10-26 DIAGNOSIS — F32A Depression, unspecified: Secondary | ICD-10-CM

## 2019-10-26 MED ORDER — SILDENAFIL CITRATE 20 MG PO TABS: 20.0000 mg | ORAL_TABLET | Freq: Every day | ORAL | 11 refills | Status: DC | PRN

## 2019-10-26 MED ORDER — BUPROPION HCL ER (XL) 150 MG PO TB24: 150.0000 mg | ORAL_TABLET | Freq: Every day | ORAL | 0 refills | Status: DC

## 2019-10-27 ENCOUNTER — Ambulatory Visit: Payer: 59 | Admitting: Psychology

## 2019-10-30 ENCOUNTER — Encounter: Payer: Self-pay | Admitting: Internal Medicine

## 2019-11-01 ENCOUNTER — Other Ambulatory Visit: Payer: Self-pay | Admitting: Internal Medicine

## 2019-11-01 DIAGNOSIS — K219 Gastro-esophageal reflux disease without esophagitis: Secondary | ICD-10-CM

## 2019-11-01 MED ORDER — PANTOPRAZOLE SODIUM 20 MG PO TBEC: 20.0000 mg | DELAYED_RELEASE_TABLET | Freq: Every day | ORAL | 3 refills | Status: DC

## 2019-11-02 ENCOUNTER — Encounter: Payer: Self-pay | Admitting: Internal Medicine

## 2019-11-02 DIAGNOSIS — N529 Male erectile dysfunction, unspecified: Secondary | ICD-10-CM | POA: Insufficient documentation

## 2019-12-20 ENCOUNTER — Other Ambulatory Visit: Payer: Self-pay | Admitting: Internal Medicine

## 2019-12-20 DIAGNOSIS — F329 Major depressive disorder, single episode, unspecified: Secondary | ICD-10-CM

## 2019-12-20 DIAGNOSIS — F32A Depression, unspecified: Secondary | ICD-10-CM

## 2019-12-20 MED ORDER — BUPROPION HCL ER (XL) 150 MG PO TB24: 150.0000 mg | ORAL_TABLET | Freq: Every day | ORAL | 3 refills | Status: DC

## 2020-01-27 ENCOUNTER — Telehealth: Payer: Self-pay | Admitting: Internal Medicine

## 2020-01-27 ENCOUNTER — Encounter: Payer: Self-pay | Admitting: Internal Medicine

## 2020-01-27 ENCOUNTER — Other Ambulatory Visit: Payer: Self-pay | Admitting: Internal Medicine

## 2020-01-27 DIAGNOSIS — Z1329 Encounter for screening for other suspected endocrine disorder: Secondary | ICD-10-CM

## 2020-01-27 DIAGNOSIS — Z125 Encounter for screening for malignant neoplasm of prostate: Secondary | ICD-10-CM

## 2020-01-27 DIAGNOSIS — E785 Hyperlipidemia, unspecified: Secondary | ICD-10-CM

## 2020-01-27 DIAGNOSIS — Z1389 Encounter for screening for other disorder: Secondary | ICD-10-CM

## 2020-01-27 DIAGNOSIS — N529 Male erectile dysfunction, unspecified: Secondary | ICD-10-CM

## 2020-01-27 DIAGNOSIS — I10 Essential (primary) hypertension: Secondary | ICD-10-CM

## 2020-01-27 DIAGNOSIS — E291 Testicular hypofunction: Secondary | ICD-10-CM

## 2020-01-27 NOTE — Telephone Encounter (Signed)
Patient is going to have these labs done at [facility]

## 2020-01-27 NOTE — Telephone Encounter (Signed)
Does he want [facility] 2 different messages from front desk staff ?   [facility] orders in for now   [place]

## 2020-01-27 NOTE — Telephone Encounter (Signed)
Pt is requesting to have labs done before his CPE. Pt wants labs sent to [facility]

## 2020-01-30 NOTE — Telephone Encounter (Signed)
Lab orders are in [facility]  I discussed with pt via my chart Friday  Labs not due until after 02/09/20 or before next appt  Happy to fill out cpe forms   [place]

## 2020-01-30 NOTE — Telephone Encounter (Signed)
Patient will have labs drawn ay [facility] one week prior to appointment in March . Patient is ending forms for completion for work, his lats CPE 02/24/19 he wants to use this CPE forf orms.

## 2020-01-31 ENCOUNTER — Ambulatory Visit: Payer: Managed Care, Other (non HMO) | Admitting: Internal Medicine

## 2020-01-31 NOTE — Telephone Encounter (Signed)
Pt dropped off CPE in form to be filled out and faxed to [phone]. Pt would also like to pick up a copy as well  Placed in Dr. Olivia Mackie color folder upfront

## 2020-02-06 ENCOUNTER — Telehealth: Payer: Self-pay | Admitting: Internal Medicine

## 2020-02-06 NOTE — Telephone Encounter (Signed)
Pt called checking status of the form. He said if he doesn't get it back soon they will start taking extra money out of his check for not having it filled out.

## 2020-02-06 NOTE — Telephone Encounter (Signed)
I received his CPE forms last week  Typically forms take 1-2 weeks to fill out and we will call him when they are ready  If this is going to be needed yearly in the future I would do way in advance before due  We will call him when forms ready   [place]

## 2020-02-10 NOTE — Telephone Encounter (Signed)
Forms faxed

## 2020-02-21 ENCOUNTER — Other Ambulatory Visit: Payer: Self-pay | Admitting: Internal Medicine

## 2020-02-21 ENCOUNTER — Encounter: Payer: Self-pay | Admitting: Internal Medicine

## 2020-02-21 DIAGNOSIS — I10 Essential (primary) hypertension: Secondary | ICD-10-CM

## 2020-02-21 MED ORDER — LOSARTAN POTASSIUM 50 MG PO TABS: 50.0000 mg | ORAL_TABLET | Freq: Every day | ORAL | 3 refills | Status: DC

## 2020-02-22 ENCOUNTER — Other Ambulatory Visit: Payer: Self-pay | Admitting: Internal Medicine

## 2020-02-22 DIAGNOSIS — R609 Edema, unspecified: Secondary | ICD-10-CM

## 2020-02-22 DIAGNOSIS — I10 Essential (primary) hypertension: Secondary | ICD-10-CM

## 2020-02-22 MED ORDER — HYDROCHLOROTHIAZIDE 12.5 MG PO TABS: 12.5000 mg | ORAL_TABLET | Freq: Every day | ORAL | 3 refills | Status: DC | PRN

## 2020-02-27 ENCOUNTER — Encounter: Payer: Self-pay | Admitting: *Deleted

## 2020-02-27 ENCOUNTER — Telehealth: Payer: Self-pay | Admitting: Internal Medicine

## 2020-02-27 ENCOUNTER — Emergency Department
Admission: EM | Admit: 2020-02-27 | Discharge: 2020-02-27 | Disposition: A | Discharge status: Home / Self Care | Payer: Managed Care, Other (non HMO) | Attending: Student in an Organized Health Care Education/Training Program | Admitting: Student in an Organized Health Care Education/Training Program

## 2020-02-27 ENCOUNTER — Emergency Department: Payer: Managed Care, Other (non HMO)

## 2020-02-27 ENCOUNTER — Other Ambulatory Visit: Payer: Self-pay

## 2020-02-27 DIAGNOSIS — I10 Essential (primary) hypertension: Secondary | ICD-10-CM | POA: Diagnosis not present

## 2020-02-27 DIAGNOSIS — R0789 Other chest pain: Secondary | ICD-10-CM

## 2020-02-27 DIAGNOSIS — Z79899 Other long term (current) drug therapy: Secondary | ICD-10-CM | POA: Diagnosis not present

## 2020-02-27 LAB — BASIC METABOLIC PANEL
Anion gap: 6 (ref 5–15)
BUN: 17 mg/dL (ref 6–20)
CO2: 32 mmol/L (ref 22–32)
Calcium: 9.5 mg/dL (ref 8.9–10.3)
Chloride: 102 mmol/L (ref 98–111)
Creatinine, Ser: 1.14 mg/dL (ref 0.61–1.24)
GFR calc Af Amer: >60 mL/min (ref >60)
GFR calc non Af Amer: >60 mL/min (ref >60)
Glucose, Bld: 96 mg/dL (ref 70–99)
Potassium: 4.2 mmol/L (ref 3.5–5.1)
Sodium: 140 mmol/L (ref 135–145)

## 2020-02-27 LAB — TROPONIN I (HIGH SENSITIVITY)
Troponin I (High Sensitivity): <2 ng/L (ref <18)
Troponin I (High Sensitivity): <2 ng/L (ref <18)

## 2020-02-27 LAB — CBC
HCT: 46.5 % (ref 39.0–52.0)
Hemoglobin: 16 g/dL (ref 13.0–17.0)
MCH: 29.5 pg (ref 26.0–34.0)
MCHC: 34.4 g/dL (ref 30.0–36.0)
MCV: 85.6 fL (ref 80.0–100.0)
Platelets: 231 10*3/uL (ref 150–400)
RBC: 5.43 MIL/uL (ref 4.22–5.81)
RDW: 13 % (ref 11.5–15.5)
WBC: 6.6 10*3/uL (ref 4.0–10.5)
nRBC: 0 % (ref 0.0–0.2)

## 2020-02-27 MED ORDER — SODIUM CHLORIDE 0.9% FLUSH: 3.0000 mL | Freq: Once | INTRAVENOUS | Status: DC

## 2020-02-27 NOTE — Telephone Encounter (Signed)
Pt called his BP was 173/94 at 11:50 and is having Tightness in Chest  Sent Pt to access nurse

## 2020-02-27 NOTE — Telephone Encounter (Signed)
Thank you Anthony Patton. Will follow his chart

## 2020-02-27 NOTE — Telephone Encounter (Signed)
With chest tightness elevated BP needs to go to ED or urgent care now Sch f/u with me in future I believe he has one or someone else likely BP meds need to be increased I.e losartan to 100 mg daily or norvasc to 5 mg daily in the future after ED f/u appt   [place]

## 2020-02-27 NOTE — Telephone Encounter (Signed)
[place] RECORD AccessNurse Patient Name: Anthony Patton Gender: Male DOB: 07-22-1971 Age: 48 Y 17 D Return Phone Number: [phone] (Primary), [phone] (Secondary) [place] [address] Client [facility] [place] Day - [facility] Client Site [place] - Day Physician McClean-Scocuzza, Olivia Mackie Contact Type Call Who Is Calling Patient / Member / Family / Caregiver Call Type Triage / Clinical Relationship To Patient Self Return Phone Number [phone] (Primary) Chief Complaint CHEST PAIN (>=21 years) - pain, pressure, heaviness or tightness Reason for Call Symptomatic / Request for [place] states his BP is 173/94 and he is having tightness in his chest. Translation No Nurse Assessment Nurse: Olevia Bowens, RN, Lauren Date/Time Eilene Ghazi Time): 02/27/2020 12:12:48 PM Confirm and document reason for call. If symptomatic, describe symptoms. ---Caller states his BP is 173/94 and he is having tightness in his chest. States he had a few things going on this morning and was feeling anxious and stressed and checked his BP and it was higher than normal. States him and Dr. Olivia Mackie have been trying to figure out his BP medication regimen. Chest tightness started around 11:00- BP was 161/91 and 151/90. Then at 11:30 it was 173/94. Currently takes Losartan and HCTZ 12.5mg . States he has this chest tightness when BP spikes. Has the patient had close contact with a person known or suspected to have the novel coronavirus illness OR traveled / lives in area with major community spread (including international travel) in the last 14 days from the onset of symptoms? * If Asymptomatic, screen for exposure and travel within the last 14 days. ---No Does the patient have any new or worsening symptoms? ---Yes Will a triage be completed?  ---Yes Related visit to physician within the last 2 weeks? ---No Does the PT have any chronic conditions? (i.e. diabetes, asthma, this includes High risk factors for pregnancy, etc.) ---Yes List chronic conditions. ---HTN, Is this a behavioral health or substance abuse call? ---No PLEASE NOTE: All timestamps contained within this report are represented as [place] Standard Time. CONFIDENTIALTY NOTICE: This fax transmission is intended only for the addressee. It contains information that is legally privileged, confidential or otherwise protected from use or disclosure. If you are not the intended recipient, you are strictly prohibited from reviewing, disclosing, copying using or disseminating any of this information or taking any action in reliance on or regarding this information. If you have received this fax in error, please notify us immediately by telephone so that we can arrange for its return to [place]. Phone: [phone], Toll-Free: [phone], Fax: [phone] Page: 2 of 2 Call Id: [id number] Guidelines Guideline Title Affirmed Question Affirmed Notes Nurse Date/Time Eilene Ghazi Time) High Blood Pressure [id number] Systolic BP >= 0000000 OR Diastolic >= 123XX123 AND [id number] cardiac or neurologic symptoms (e.g., chest pain, difficulty breathing, unsteady gait, blurred vision) Olevia Bowens, RN, Lauren 02/27/2020 12:17:31 PM Disp. Time Eilene Ghazi Time) Disposition Final User 02/27/2020 12:11:13 PM Send to Urgent Frederica Kuster 02/27/2020 12:19:43 PM Go to ED Now Yes Olevia Bowens, RN, Lauren Caller Disagree/Comply Disagree Caller Understands Yes PreDisposition Call Doctor Care Advice Given Per Guideline GO TO ED NOW: * You need to be seen in the Emergency Department. * Go to the ED at ___________ [place] now. Drive carefully. CARE ADVICE given per High Blood Pressure (Adult) guideline. NOTE TO TRIAGER - DRIVING: * Another adult should drive. * If immediate transportation is not available via car or taxi,  then  the patient should be instructed to call [id number]. Comments User: Andee Poles, RN Date/Time Eilene Ghazi Time): 02/27/2020 12:22:25 PM Caller states he is going to try and relax and see if he cant get his BP down himself. States him and his wife are sharing a car and he would have to go pick her up to take him. Advised patient of the importance of getting his symptoms checked out immediately as they are concerning. Caller verbalizes understanding. Referrals GO TO FACILITY REFUSED

## 2020-02-27 NOTE — Discharge Instructions (Signed)
Your blood work today is normal.  Please follow up with Dr. Terese Door to discuss management of your blood pressure.

## 2020-02-27 NOTE — Telephone Encounter (Signed)
For your information  

## 2020-02-27 NOTE — ED Triage Notes (Signed)
Pt reports elevated blood pressure and chest pressure today.  No sob.  Non smoker.  No cough.  Pt took blood pressure meds today.  Pt alert  Speech clear.

## 2020-02-27 NOTE — ED Provider Notes (Signed)
[facility] Emergency Department Provider Note    First MD Initiated Contact with Patient 02/27/20 1751     (approximate)  I have reviewed the triage vital signs and the nursing notes.   HISTORY  Chief Complaint Chest Pain    HPI Anthony Patton. is a 49 y.o. male   symptoms as described above presents to the ER for evaluation of chest discomfort.  Symptoms started around 10 or 11:00 this morning.  He was at work.  States he was feeling stressed.  Did notice his blood pressure was elevated.  Is been having issues with elevated blood pressure.  He called his PCP to discuss his symptoms think they may need increasing blood pressure medication but given his chest discomfort was sent to the ER.  He is currently pain-free.  Feels much more relaxed.  Denies any shortness of breath.  No pain or discomfort with taking inspiration.  No abdominal pain.  No nausea.  Not have any diaphoresis.  No leg swelling.   Past Medical History:  Diagnosis Date  . Allergy   . Chicken pox   . Colon polyps   . Gastritis    with esophagitis   . GERD (gastroesophageal reflux disease)   . Hyperlipidemia   . Hypertension   . Low back pain    Family History  Problem Relation Age of Onset  . Diabetes Mother   . Hypertension Mother   . Diabetes Father   . Hypertension Father   . Hyperlipidemia Father   . Diabetes Maternal Grandmother   . Heart disease Maternal Grandfather        MI age 68s-50s   . Hyperlipidemia Paternal Grandmother   . Diabetes Paternal Grandfather   . Cancer Paternal Grandfather        lung cancer smoker    Past Surgical History:  Procedure Laterality Date  . APPENDECTOMY    . COLONOSCOPY W/ POLYPECTOMY    . KNEE SURGERY     arthroscopy x 2 torn cartilage   . LASIK     b/l  . TONSILLECTOMY     Patient Active Problem List   Diagnosis Date Noted  . ED (erectile dysfunction) 11/02/2019  . Annual physical exam 02/24/2019  . Allergic rhinitis  02/24/2019  . Chronic right-sided low back pain with right-sided sciatica 02/24/2019  . Hyperlipidemia 02/24/2019  . Essential hypertension 12/21/2018  . Gastroesophageal reflux disease 12/21/2018      Prior to Admission medications   Medication Sig Start Date End Date Taking? Authorizing Provider  amLODipine (NORVASC) 2.5 MG tablet Take 1 tablet (2.5 mg total) by mouth daily. For BP >130/>80 Patient not taking: Reported on 09/28/2019 08/30/19   McLean-Scocuzza, Nino Glow, MD  buPROPion (WELLBUTRIN XL) 150 MG 24 hr tablet Take 1 tablet (150 mg total) by mouth daily. 12/20/19   McLean-Scocuzza, Nino Glow, MD  fexofenadine (ALLEGRA) 180 MG tablet Take 180 mg by mouth daily as needed for allergies or rhinitis.    [provider]  hydrochlorothiazide (HYDRODIURIL) 12.5 MG tablet Take 1 tablet (12.5 mg total) by mouth daily as needed. Elevated BP >130/80 or edema 02/22/20   McLean-Scocuzza, Nino Glow, MD  losartan (COZAAR) 50 MG tablet Take 1 tablet (50 mg total) by mouth daily. 02/21/20   McLean-Scocuzza, Nino Glow, MD  montelukast (SINGULAIR) 10 MG tablet Take 1 tablet (10 mg total) by mouth at bedtime. 02/24/19   McLean-Scocuzza, Nino Glow, MD  naproxen (NAPROSYN) 500 MG tablet Take 1 tablet (  500 mg total) by mouth 2 (two) times daily as needed. With food 06/14/19   McLean-Scocuzza, Nino Glow, MD  pantoprazole (PROTONIX) 20 MG tablet Take 1 tablet (20 mg total) by mouth daily. 30 minutes before food 11/01/19   McLean-Scocuzza, Nino Glow, MD  sildenafil (REVATIO) 20 MG tablet Take 1-5 tablets (20-100 mg total) by mouth daily as needed. 10/26/19   McLean-Scocuzza, Nino Glow, MD    Allergies Hctz [hydrochlorothiazide] and Penicillins    Social History Social History   Tobacco Use  . Smoking status: Never Smoker  . Smokeless tobacco: Never Used  Substance Use Topics  . Alcohol use: Yes  . Drug use: Not Currently    Review of Systems Patient denies headaches, rhinorrhea, blurry vision, numbness,  shortness of breath, chest pain, edema, cough, abdominal pain, nausea, vomiting, diarrhea, dysuria, fevers, rashes or hallucinations unless otherwise stated above in HPI. ____________________________________________   PHYSICAL EXAM:  VITAL SIGNS: Vitals:   02/27/20 1833 02/27/20 1915  BP: (!) 133/97   Pulse: 62   Resp: 18   Temp:  97.8 F (36.6 C)  SpO2: 100%     Constitutional: Alert and oriented.  Eyes: Conjunctivae are normal.  Head: Atraumatic. Nose: No congestion/rhinnorhea. Mouth/Throat: Mucous membranes are moist.   Neck: No stridor. Painless ROM.  Cardiovascular: Normal rate, regular rhythm. Grossly normal heart sounds.  Good peripheral circulation. Respiratory: Normal respiratory effort.  No retractions. Lungs CTAB. Gastrointestinal: Soft and nontender. No distention. No abdominal bruits. No CVA tenderness. Genitourinary:  Musculoskeletal: No lower extremity tenderness nor edema.  No joint effusions. Neurologic:  Normal speech and language. No gross focal neurologic deficits are appreciated. No facial droop Skin:  Skin is warm, dry and intact. No rash noted. Psychiatric: Mood and affect are normal. Speech and behavior are normal.  ____________________________________________   LABS (all labs ordered are listed, but only abnormal results are displayed)  Results for orders placed or performed during the hospital encounter of 02/27/20 (from the past 24 hour(s))  Basic metabolic panel     Status: None   Collection Time: 02/27/20  4:20 PM  Result Value Ref Range   Sodium 140 135 - 145 mmol/L   Potassium 4.2 3.5 - 5.1 mmol/L   Chloride 102 98 - 111 mmol/L   CO2 32 22 - 32 mmol/L   Glucose, Bld 96 70 - 99 mg/dL   BUN 17 6 - 20 mg/dL   Creatinine, Ser 1.14 0.61 - 1.24 mg/dL   Calcium 9.5 8.9 - 10.3 mg/dL   GFR calc non Af Amer >60 >60 mL/min   GFR calc Af Amer >60 >60 mL/min   Anion gap 6 5 - 15  CBC     Status: None   Collection Time: 02/27/20  4:20 PM  Result  Value Ref Range   WBC 6.6 4.0 - 10.5 K/uL   RBC 5.43 4.22 - 5.81 MIL/uL   Hemoglobin 16.0 13.0 - 17.0 g/dL   HCT 46.5 39.0 - 52.0 %   MCV 85.6 80.0 - 100.0 fL   MCH 29.5 26.0 - 34.0 pg   MCHC 34.4 30.0 - 36.0 g/dL   RDW 13.0 11.5 - 15.5 %   Platelets 231 150 - 400 K/uL   nRBC 0.0 0.0 - 0.2 %  Troponin I (High Sensitivity)     Status: None   Collection Time: 02/27/20  4:20 PM  Result Value Ref Range   Troponin I (High Sensitivity) <2 <18 ng/L  Troponin I (High Sensitivity)  Status: None   Collection Time: 02/27/20  6:34 PM  Result Value Ref Range   Troponin I (High Sensitivity) <2 <18 ng/L   ____________________________________________  EKG My review and personal interpretation at Time: 16:17   Indication: chest pain  Rate: 60  Rhythm: sinus Axis: left Other: normal intervals, no stemi, poor r wave progression ____________________________________________  RADIOLOGY  I personally reviewed all radiographic images ordered to evaluate for the above acute complaints and reviewed radiology reports and findings.  These findings were personally discussed with the patient.  Please see medical record for radiology report.  ____________________________________________   PROCEDURES  Procedure(s) performed:  Procedures    Critical Care performed: no ____________________________________________   INITIAL IMPRESSION / ASSESSMENT AND PLAN / ED COURSE  Pertinent labs & imaging results that were available during my care of the patient were reviewed by me and considered in my medical decision making (see chart for details).   DDX: ACS, pericarditis, esophagitis, boerhaaves, pe, dissection, pna, bronchitis, costochondritis   Loyal Pitstick. is a 49 y.o. who presents to the ED with symptoms as described above.  Patient currently well-appearing in no acute distress.  Work-up initiated in triage shows normal troponin BMP and CBC are reassuring.  Chest x-ray without infiltrate.   Not consistent with dissection or PE.  Abdominal exam is soft and benign.  Mildly elevated blood pressure no evidence of heart failure.  The patient will be placed on continuous pulse oximetry and telemetry for monitoring.  Laboratory evaluation will be sent to evaluate for the above complaints.    Serial enzymes were negative.  Patient remains chest pain-free.  Blood pressure came down on its own.  Patient appropriate for outpatient follow-up.    The patient was evaluated in Emergency Department today for the symptoms described in the history of present illness. He/she was evaluated in the context of the global COVID-19 pandemic, which necessitated consideration that the patient might be at risk for infection with the SARS-CoV-2 virus that causes COVID-19. Institutional protocols and algorithms that pertain to the evaluation of patients at risk for COVID-19 are in a state of rapid change based on information released by regulatory bodies including the CDC and federal and state organizations. These policies and algorithms were followed during the patient's care in the ED.  As part of my medical decision making, I reviewed the following data within the [place] notes reviewed and incorporated, Labs reviewed, notes from prior ED visits and [place] Controlled Substance Database   ____________________________________________   FINAL CLINICAL IMPRESSION(S) / ED DIAGNOSES  Final diagnoses:  Hypertension, unspecified type  Atypical chest pain      NEW MEDICATIONS STARTED DURING THIS VISIT:  Discharge Medication List as of 02/27/2020  7:11 PM       Note:  This document was prepared using Dragon voice recognition software and may include unintentional dictation errors.    Merlyn Lot, MD 02/27/20 Joen [place]

## 2020-02-27 NOTE — Telephone Encounter (Signed)
FYI

## 2020-02-27 NOTE — ED Notes (Signed)
Pt offered food and fluids at this time. Pt refused.

## 2020-02-27 NOTE — Telephone Encounter (Signed)
Called patient and advised with BP high and feeling anxious with chest tightness he cannot wait to be evaluated that he should go to the ER . Patient stated he would have his wife drive him to the hospital.

## 2020-02-28 NOTE — Telephone Encounter (Signed)
Noted  Will hold to update Dr Olivia Mackie when back in office.

## 2020-03-05 NOTE — Telephone Encounter (Signed)
Reviewed chart  Schedule f/u sooner with me if needed

## 2020-03-05 NOTE — Telephone Encounter (Signed)
Left message to return call. ° °Mychart message sent  °

## 2020-03-05 NOTE — Telephone Encounter (Signed)
For your information  

## 2020-03-07 NOTE — Telephone Encounter (Signed)
Spoke with patient. States he is doing fine and will keep his appointment at 3/18 8:30 am

## 2020-03-13 LAB — CBC WITH DIFFERENTIAL/PLATELET
Basophils Absolute: 0.1 10*3/uL (ref 0.0–0.2)
Basos: 2 %
EOS (ABSOLUTE): 0.2 10*3/uL (ref 0.0–0.4)
Eos: 4 %
Hematocrit: 46.9 % (ref 37.5–51.0)
Hemoglobin: 15.9 g/dL (ref 13.0–17.7)
Immature Grans (Abs): 0 10*3/uL (ref 0.0–0.1)
Immature Granulocytes: 0 %
Lymphocytes Absolute: 1.4 10*3/uL (ref 0.7–3.1)
Lymphs: 28 %
MCH: 30.2 pg (ref 26.6–33.0)
MCHC: 33.9 g/dL (ref 31.5–35.7)
MCV: 89 fL (ref 79–97)
Monocytes Absolute: 0.4 10*3/uL (ref 0.1–0.9)
Monocytes: 8 %
Neutrophils Absolute: 2.9 10*3/uL (ref 1.4–7.0)
Neutrophils: 58 %
Platelets: 210 10*3/uL (ref 150–450)
RBC: 5.26 x10E6/uL (ref 4.14–5.80)
RDW: 13.3 % (ref 11.6–15.4)
WBC: 5.1 10*3/uL (ref 3.4–10.8)

## 2020-03-13 LAB — COMPREHENSIVE METABOLIC PANEL
ALT: 31 IU/L (ref 0–44)
AST: 34 IU/L (ref 0–40)
Albumin/Globulin Ratio: 2.4 — ABNORMAL HIGH (ref 1.2–2.2)
Albumin: 4.7 g/dL (ref 3.8–4.9)
Alkaline Phosphatase: 107 IU/L (ref 39–117)
BUN/Creatinine Ratio: 18 (ref 9–20)
BUN: 22 mg/dL (ref 6–24)
Bilirubin Total: 0.5 mg/dL (ref 0.0–1.2)
CO2: 26 mmol/L (ref 20–29)
Calcium: 9.7 mg/dL (ref 8.7–10.2)
Chloride: 101 mmol/L (ref 96–106)
Creatinine, Ser: 1.25 mg/dL (ref 0.76–1.27)
GFR calc Af Amer: 77 mL/min/{1.73_m2} (ref >59)
GFR calc non Af Amer: 66 mL/min/{1.73_m2} (ref >59)
Globulin, Total: 2 g/dL (ref 1.5–4.5)
Glucose: 99 mg/dL (ref 65–99)
Potassium: 4.4 mmol/L (ref 3.5–5.2)
Sodium: 146 mmol/L — ABNORMAL HIGH (ref 134–144)
Total Protein: 6.7 g/dL (ref 6.0–8.5)

## 2020-03-13 LAB — LIPID PANEL
Chol/HDL Ratio: 5.5 ratio — ABNORMAL HIGH (ref 0.0–5.0)
Cholesterol, Total: 227 mg/dL — ABNORMAL HIGH (ref 100–199)
HDL: 41 mg/dL (ref >39)
LDL Chol Calc (NIH): 163 mg/dL — ABNORMAL HIGH (ref 0–99)
Triglycerides: 126 mg/dL (ref 0–149)
VLDL Cholesterol Cal: 23 mg/dL (ref 5–40)

## 2020-03-13 LAB — URINALYSIS, ROUTINE W REFLEX MICROSCOPIC
Bilirubin, UA: NEGATIVE
Glucose, UA: NEGATIVE
Ketones, UA: NEGATIVE
Leukocytes,UA: NEGATIVE
Nitrite, UA: NEGATIVE
Protein,UA: NEGATIVE
RBC, UA: NEGATIVE
Specific Gravity, UA: >=1.03 — AB (ref 1.005–1.030)
Urobilinogen, Ur: 0.2 mg/dL (ref 0.2–1.0)
pH, UA: 5 (ref 5.0–7.5)

## 2020-03-13 LAB — TSH: TSH: 1.23 u[IU]/mL (ref 0.450–4.500)

## 2020-03-13 LAB — PSA: Prostate Specific Ag, Serum: 1.1 ng/mL (ref 0.0–4.0)

## 2020-03-13 LAB — TESTOSTERONE: Testosterone: 425 ng/dL (ref 264–916)

## 2020-03-14 ENCOUNTER — Other Ambulatory Visit: Payer: Self-pay | Admitting: Internal Medicine

## 2020-03-14 DIAGNOSIS — E785 Hyperlipidemia, unspecified: Secondary | ICD-10-CM

## 2020-03-14 MED ORDER — ATORVASTATIN CALCIUM 10 MG PO TABS: 10.0000 mg | ORAL_TABLET | Freq: Every day | ORAL | 3 refills | Status: DC

## 2020-03-15 ENCOUNTER — Encounter: Payer: Self-pay | Admitting: Internal Medicine

## 2020-03-15 ENCOUNTER — Encounter: Payer: Managed Care, Other (non HMO) | Admitting: Internal Medicine

## 2020-03-15 DIAGNOSIS — K219 Gastro-esophageal reflux disease without esophagitis: Secondary | ICD-10-CM

## 2020-03-15 MED ORDER — PANTOPRAZOLE SODIUM 20 MG PO TBEC: 20.0000 mg | DELAYED_RELEASE_TABLET | Freq: Every day | ORAL | 1 refills | Status: DC

## 2020-03-16 ENCOUNTER — Ambulatory Visit (INDEPENDENT_AMBULATORY_CARE_PROVIDER_SITE_OTHER): Payer: Managed Care, Other (non HMO) | Admitting: Internal Medicine

## 2020-03-16 ENCOUNTER — Other Ambulatory Visit: Payer: Self-pay

## 2020-03-16 ENCOUNTER — Encounter: Payer: Self-pay | Admitting: Internal Medicine

## 2020-03-16 VITALS — BP 128/82 | HR 69 | Temp 97.2°F | Ht 66.0 in | Wt 158.2 lb

## 2020-03-16 DIAGNOSIS — F329 Major depressive disorder, single episode, unspecified: Secondary | ICD-10-CM

## 2020-03-16 DIAGNOSIS — R079 Chest pain, unspecified: Secondary | ICD-10-CM

## 2020-03-16 DIAGNOSIS — R937 Abnormal findings on diagnostic imaging of other parts of musculoskeletal system: Secondary | ICD-10-CM

## 2020-03-16 DIAGNOSIS — Z0000 Encounter for general adult medical examination without abnormal findings: Secondary | ICD-10-CM

## 2020-03-16 DIAGNOSIS — Z1283 Encounter for screening for malignant neoplasm of skin: Secondary | ICD-10-CM

## 2020-03-16 DIAGNOSIS — E785 Hyperlipidemia, unspecified: Secondary | ICD-10-CM

## 2020-03-16 DIAGNOSIS — F419 Anxiety disorder, unspecified: Secondary | ICD-10-CM | POA: Diagnosis not present

## 2020-03-16 DIAGNOSIS — I10 Essential (primary) hypertension: Secondary | ICD-10-CM

## 2020-03-16 DIAGNOSIS — M545 Low back pain, unspecified: Secondary | ICD-10-CM

## 2020-03-16 DIAGNOSIS — G8929 Other chronic pain: Secondary | ICD-10-CM

## 2020-03-16 MED ORDER — BUPROPION HCL ER (XL) 150 MG PO TB24: 150.0000 mg | ORAL_TABLET | Freq: Every day | ORAL | 3 refills | Status: DC

## 2020-03-16 MED ORDER — AMLODIPINE BESYLATE 2.5 MG PO TABS: 2.5000 mg | ORAL_TABLET | Freq: Every day | ORAL | 3 refills | Status: DC

## 2020-03-16 MED ORDER — HYDROXYZINE HCL 25 MG PO TABS: 25.0000 mg | ORAL_TABLET | Freq: Every evening | ORAL | 0 refills | Status: DC | PRN

## 2020-03-16 MED ORDER — LOSARTAN POTASSIUM 100 MG PO TABS: 100.0000 mg | ORAL_TABLET | Freq: Every day | ORAL | 3 refills | Status: DC

## 2020-03-16 NOTE — Patient Instructions (Addendum)
Dr. Alice Reichert in [facility]   Dermatology Dr. Kellie Moor [phone]  Dr. Sharlet Salina back injections in the future consider   Cardiology [place] will schedule Dr. Lorin Mercy    Stress relax brand L theanine Tranquil Sleep anxiety/stress -whole foods or amazon  Hold allegra   Hydroxyzine capsules or tablets What is this medicine? HYDROXYZINE (hye Ragland i zeen) is an antihistamine. This medicine is used to treat allergy symptoms. It is also used to treat anxiety and tension. This medicine can be used with other medicines to induce sleep before surgery. This medicine may be used for other purposes; ask your health care provider or pharmacist if you have questions. COMMON BRAND NAME(S): ANX, Atarax, Rezine, Vistaril What should I tell my health care provider before I take this medicine? They need to know if you have any of these conditions:  glaucoma  heart disease  history of irregular heartbeat  kidney disease  liver disease  lung or breathing disease, like asthma  stomach or intestine problems  thyroid disease  trouble passing urine  an unusual or allergic reaction to hydroxyzine, cetirizine, other medicines, foods, dyes or preservatives  pregnant or trying to get pregnant  breast-feeding How should I use this medicine? Take this medicine by mouth with a full glass of water. Follow the directions on the prescription label. You may take this medicine with food or on an empty stomach. Take your medicine at regular intervals. Do not take your medicine more often than directed. Talk to your pediatrician regarding the use of this medicine in children. Special care may be needed. While this drug may be prescribed for children as young as 73 years of age for selected conditions, precautions do apply. Patients over 16 years old may have a stronger reaction and need a smaller dose. Overdosage: If you think you have taken too much of this medicine contact a poison control  center or emergency room at once. NOTE: This medicine is only for you. Do not share this medicine with others. What if I miss a dose? If you miss a dose, take it as soon as you can. If it is almost time for your next dose, take only that dose. Do not take double or extra doses. What may interact with this medicine? Do not take this medicine with any of the following medications:  cisapride  dronedarone  pimozide  thioridazine This medicine may also interact with the following medications:  alcohol  antihistamines for allergy, cough, and cold  atropine  barbiturate medicines for sleep or seizures, like phenobarbital  certain antibiotics like erythromycin or clarithromycin  certain medicines for anxiety or sleep  certain medicines for bladder problems like oxybutynin, tolterodine  certain medicines for depression or psychotic disturbances  certain medicines for irregular heart beat  certain medicines for Parkinson's disease like benztropine, trihexyphenidyl  certain medicines for seizures like phenobarbital, primidone  certain medicines for stomach problems like dicyclomine, hyoscyamine  certain medicines for travel sickness like scopolamine  ipratropium  narcotic medicines for pain  other medicines that prolong the QT interval (an abnormal heart rhythm) like dofetilide This list may not describe all possible interactions. Give your health care provider a list of all the medicines, herbs, non-prescription drugs, or dietary supplements you use. Also tell them if you smoke, drink alcohol, or use illegal drugs. Some items may interact with your medicine. What should I watch for while using this medicine? Tell your doctor or health care professional if your symptoms do not improve. You  may get drowsy or dizzy. Do not drive, use machinery, or do anything that needs mental alertness until you know how this medicine affects you. Do not stand or sit up quickly, especially if  you are an older patient. This reduces the risk of dizzy or fainting spells. Alcohol may interfere with the effect of this medicine. Avoid alcoholic drinks. Your mouth may get dry. Chewing sugarless gum or sucking hard candy, and drinking plenty of water may help. Contact your doctor if the problem does not go away or is severe. This medicine may cause dry eyes and blurred vision. If you wear contact lenses you may feel some discomfort. Lubricating drops may help. See your eye doctor if the problem does not go away or is severe. If you are receiving skin tests for allergies, tell your doctor you are using this medicine. What side effects may I notice from receiving this medicine? Side effects that you should report to your doctor or health care professional as soon as possible:  allergic reactions like skin rash, itching or hives, swelling of the face, lips, or tongue  changes in vision  confusion  fast, irregular heartbeat  seizures  tremor  trouble passing urine or change in the amount of urine Side effects that usually do not require medical attention (report to your doctor or health care professional if they continue or are bothersome):  constipation  drowsiness  dry mouth  headache  tiredness This list may not describe all possible side effects. Call your doctor for medical advice about side effects. You may report side effects to FDA at 1-800-FDA-1088. Where should I keep my medicine? Keep out of the reach of children. Store at room temperature between 15 and 30 degrees C (59 and 86 degrees F). Keep container tightly closed. Throw away any unused medicine after the expiration date. NOTE: This sheet is a summary. It may not cover all possible information. If you have questions about this medicine, talk to your doctor, pharmacist, or health care provider.  2020 Elsevier/Gold Standard (2018-12-06 13:19:55)   Colonoscopy, Adult A colonoscopy is a procedure to look at the  entire large intestine. This procedure is done using a long, thin, flexible tube that has a camera on the end. You may have a colonoscopy:  As a part of normal colorectal screening.  If you have certain symptoms, such as: ? A low number of red blood cells in your blood (anemia). ? Diarrhea that does not go away. ? Pain in your abdomen. ? Blood in your stool. A colonoscopy can help screen for and diagnose medical problems, including:  Tumors.  Extra tissue that grows where mucus forms (polyps).  Inflammation.  Areas of bleeding. Tell your health care provider about:  Any allergies you have.  All medicines you are taking, including vitamins, herbs, eye drops, creams, and over-the-counter medicines.  Any problems you or family members have had with anesthetic medicines.  Any blood disorders you have.  Any surgeries you have had.  Any medical conditions you have.  Any problems you have had with having bowel movements.  Whether you are pregnant or may be pregnant. What are the risks? Generally, this is a safe procedure. However, problems may occur, including:  Bleeding.  Damage to your intestine.  Allergic reactions to medicines given during the procedure.  Infection. This is rare. What happens before the procedure? Eating and drinking restrictions Follow instructions from your health care provider about eating or drinking restrictions, which may include:  A  few days before the procedure: ? Follow a low-fiber diet. ? Avoid nuts, seeds, dried fruit, raw fruits, and vegetables.  1-3 days before the procedure: ? Eat only gelatin dessert or ice pops. ? Drink only clear liquids, such as water, clear juice, clear broth or bouillon, black coffee or tea, or clear soft drinks or sports drinks. ? Avoid liquids that contain red or purple dye.  The day of the procedure: ? Do not eat solid foods. You may continue to drink clear liquids until up to 2 hours before the  procedure. ? Do not eat or drink anything starting 2 hours before the procedure, or within the time period that your health care provider recommends. Bowel prep If you were prescribed a bowel prep to take by mouth (orally) to clean out your colon:  Take it as told by your health care provider. Starting the day before your procedure, you will need to drink a large amount of liquid medicine. The liquid will cause you to have many bowel movements of loose stool until your stool becomes almost clear or light green.  If your skin or the opening between the buttocks (anus) gets irritated from diarrhea, you may relieve the irritation using: ? Wipes with medicine in them, such as adult wet wipes with aloe and vitamin E. ? A product to soothe skin, such as petroleum jelly.  If you vomit while drinking the bowel prep: ? Take a break for up to 60 minutes. ? Begin the bowel prep again. ? Call your health care provider if you keep vomiting or you cannot take the bowel prep without vomiting.  To clean out your colon, you may also be given: ? Laxative medicines. These help you have a bowel movement. ? Instructions for enema use. An enema is liquid medicine injected into your rectum. Medicines Ask your health care provider about:  Changing or stopping your regular medicines or supplements. This is especially important if you are taking iron supplements, diabetes medicines, or blood thinners.  Taking medicines such as aspirin and ibuprofen. These medicines can thin your blood. Do not take these medicines unless your health care provider tells you to take them.  Taking over-the-counter medicines, vitamins, herbs, and supplements. General instructions  Ask your health care provider what steps will be taken to help prevent infection. These may include washing skin with a germ-killing soap.  Plan to have someone take you home from the hospital or clinic. What happens during the procedure?   An IV will  be inserted into one of your veins.  You may be given one or more of the following: ? A medicine to help you relax (sedative). ? A medicine to numb the area (local anesthetic). ? A medicine to make you fall asleep (general anesthetic). This is rarely needed.  You will lie on your side with your knees bent.  The tube will: ? Have oil or gel put on it (be lubricated). ? Be inserted into your anus. ? Be gently eased through all parts of your large intestine.  Air will be sent into your colon to keep it open. This may cause some pressure or cramping.  Images will be taken with the camera and will appear on a screen.  A small tissue sample may be removed to be looked at under a microscope (biopsy). The tissue may be sent to a lab for testing if any signs of problems are found.  If small polyps are found, they may be removed and checked  for cancer cells.  When the procedure is finished, the tube will be removed. The procedure may vary among health care providers and hospitals. What happens after the procedure?  Your blood pressure, heart rate, breathing rate, and blood oxygen level will be monitored until you leave the hospital or clinic.  You may have a small amount of blood in your stool.  You may pass gas and have mild cramping or bloating in your abdomen. This is caused by the air that was used to open your colon during the exam.  Do not drive for 24 hours after the procedure.  It is up to you to get the results of your procedure. Ask your health care provider, or the department that is doing the procedure, when your results will be ready. Summary  A colonoscopy is a procedure to look at the entire large intestine.  Follow instructions from your health care provider about eating and drinking before the procedure.  If you were prescribed an oral bowel prep to clean out your colon, take it as told by your health care provider.  During the colonoscopy, a flexible tube with a  camera on its end is inserted into the anus and then passed into the other parts of the large intestine. This information is not intended to replace advice given to you by your health care provider. Make sure you discuss any questions you have with your health care provider. Document Revised: 07/08/2019 Document Reviewed: 07/08/2019 Elsevier Patient Education  [place].

## 2020-03-16 NOTE — Progress Notes (Signed)
Chief Complaint  Patient presents with  . Annual Exam   Annual  1. BP sl elevated today on hctz 12.5 mg qd and losartan 50 mg qd he did have recent ED visit for CP and elevated BP  Previously hctz with increased exercise in summer causes dizziness FH p GF MI in 40s  2. Anxiety/depression agreeable to see osman GAD 7 10 and PHQ 9 8 did not take wellbutrin 150 mg xl for long but wants to retry and disc other options for anxiety 3. FH MI pGF in 18s and pt had ED visit recently 02/27/20 for chest pain HTN 133/97 with h/o HTN and HLD  4. Abnormal MRI low back and chronic low back pain 5/10   03/22/2019 seen Dr. Sharlet Salina 06/03/2019 rec PT and consideration could be made for medial branch blocks with radiofrequency neurotomy.   IMPRESSION: 1. Mild degenerative disc disease at L2-L3 and L3-L4. No stenosis or impingement. 2. Moderate right facet arthropathy at L4-L5 with reactive perifacet marrow edema, which can be a source of back pain.    Review of Systems  Constitutional: Negative for weight loss.  HENT: Negative for hearing loss.   Eyes: Negative for blurred vision.  Respiratory: Negative for shortness of breath.   Cardiovascular: Positive for chest pain.  Gastrointestinal: Negative for abdominal pain.  Musculoskeletal: Positive for back pain.  Skin: Negative for rash.  Psychiatric/Behavioral: The patient is nervous/anxious.    Past Medical History:  Diagnosis Date  . Allergy   . Chicken pox   . Colon polyps   . Gastritis    with esophagitis   . GERD (gastroesophageal reflux disease)   . Hyperlipidemia   . Hypertension   . Low back pain    Past Surgical History:  Procedure Laterality Date  . APPENDECTOMY    . COLONOSCOPY W/ POLYPECTOMY    . KNEE SURGERY     arthroscopy x 2 torn cartilage   . LASIK     b/l  . TONSILLECTOMY     Family History  Problem Relation Age of Onset  . Diabetes Mother   . Hypertension Mother   . Heart attack Mother        mid 37s  . Diabetes  Father   . Hypertension Father   . Hyperlipidemia Father   . Colon polyps Father        precancerous   . Diabetes Maternal Grandmother   . Heart disease Maternal Grandfather        MI age 61s-50s   . Hyperlipidemia Paternal Grandmother   . Valvular heart disease Paternal Grandmother   . Diabetes Paternal Grandfather   . Cancer Paternal Grandfather        lung cancer smoker   . Heart attack Paternal Grandfather        in age 54s    Social History   Socioeconomic History  . Marital status: Married    Spouse name: Not on file  . Number of children: Not on file  . Years of education: Not on file  . Highest education level: Not on file  Occupational History  . Not on file  Tobacco Use  . Smoking status: Never Smoker  . Smokeless tobacco: Never Used  Substance and Sexual Activity  . Alcohol use: Yes  . Drug use: Not Currently  . Sexual activity: Yes  Other Topics Concern  . Not on file  Social History Narrative   Married    Moved from [place] in [place]  Associates degree   Kids x2   No guns   Wears seat belt    Safe in relationship    Social Determinants of Health   Financial Resource Strain:   . Difficulty of Paying Living Expenses:   Food Insecurity:   . Worried About Charity fundraiser in the Last Year:   . Arboriculturist in the Last Year:   Transportation Needs:   . Film/video editor (Medical):   Anthony Patton Lack of Transportation (Non-Medical):   Physical Activity:   . Days of Exercise per Week:   . Minutes of Exercise per Session:   Stress:   . Feeling of Stress :   Social Connections:   . Frequency of Communication with Friends and Family:   . Frequency of Social Gatherings with Friends and Family:   . Attends Religious Services:   . Active Member of Clubs or Organizations:   . Attends Archivist Meetings:   Anthony Patton Marital Status:   Intimate Partner Violence:   . Fear of Current or Ex-Partner:   . Emotionally Abused:   Anthony Patton Physically Abused:   .  Sexually Abused:    Current Meds  Medication Sig  . atorvastatin (LIPITOR) 10 MG tablet Take 1 tablet (10 mg total) by mouth daily at 6 PM.  . fexofenadine (ALLEGRA) 180 MG tablet Take 180 mg by mouth daily as needed for allergies or rhinitis.  Anthony Patton losartan (COZAAR) 100 MG tablet Take 1 tablet (100 mg total) by mouth daily.  . pantoprazole (PROTONIX) 20 MG tablet Take 1 tablet (20 mg total) by mouth daily. 30 minutes before food  . sildenafil (REVATIO) 20 MG tablet Take 1-5 tablets (20-100 mg total) by mouth daily as needed.  . [DISCONTINUED] hydrochlorothiazide (HYDRODIURIL) 12.5 MG tablet Take 1 tablet (12.5 mg total) by mouth daily as needed. Elevated BP >130/80 or edema  . [DISCONTINUED] losartan (COZAAR) 50 MG tablet Take 1 tablet (50 mg total) by mouth daily.   Allergies  Allergen Reactions  . Hctz [Hydrochlorothiazide]     Dizziness    . Penicillins Rash    Rash as chid but able to tolerate amoxicillin    Recent Results (from the past 2160 hour(s))  Basic metabolic panel     Status: None   Collection Time: 02/27/20  4:20 PM  Result Value Ref Range   Sodium 140 135 - 145 mmol/L   Potassium 4.2 3.5 - 5.1 mmol/L   Chloride 102 98 - 111 mmol/L   CO2 32 22 - 32 mmol/L   Glucose, Bld 96 70 - 99 mg/dL    Comment: Glucose reference range applies only to samples taken after fasting for at least 8 hours.   BUN 17 6 - 20 mg/dL   Creatinine, Ser 1.14 0.61 - 1.24 mg/dL   Calcium 9.5 8.9 - 10.3 mg/dL   GFR calc non Af Amer >60 >60 mL/min   GFR calc Af Amer >60 >60 mL/min   Anion gap 6 5 - 15    Comment: Performed at [facility], [address] [place], [place] [address]  CBC     Status: None   Collection Time: 02/27/20  4:20 PM  Result Value Ref Range   WBC 6.6 4.0 - 10.5 K/uL   RBC 5.43 4.22 - 5.81 MIL/uL   Hemoglobin 16.0 13.0 - 17.0 g/dL   HCT 46.5 39.0 - 52.0 %   MCV 85.6 80.0 - 100.0 fL   MCH 29.5 26.0 -  34.0 pg   MCHC 34.4 30.0 - 36.0 g/dL   RDW 13.0 11.5 -  15.5 %   Platelets 231 150 - 400 K/uL   nRBC 0.0 0.0 - 0.2 %    Comment: Performed at [facility], [place], [place] [address]  Troponin I (High Sensitivity)     Status: None   Collection Time: 02/27/20  4:20 PM  Result Value Ref Range   Troponin I (High Sensitivity) <2 <18 ng/L    Comment: (NOTE) Elevated high sensitivity troponin I (hsTnI) values and significant  changes across serial measurements may suggest ACS but many other  chronic and acute conditions are known to elevate hsTnI results.  Refer to the "Links" section for chest pain algorithms and additional  guidance. Performed at [facility], [place], [place] [address]   Troponin I (High Sensitivity)     Status: None   Collection Time: 02/27/20  6:34 PM  Result Value Ref Range   Troponin I (High Sensitivity) <2 <18 ng/L    Comment: (NOTE) Elevated high sensitivity troponin I (hsTnI) values and significant  changes across serial measurements may suggest ACS but many other  chronic and acute conditions are known to elevate hsTnI results.  Refer to the "Links" section for chest pain algorithms and additional  guidance. Performed at [facility], [address] [place], [place] [address]   Comprehensive metabolic panel     Status: Abnormal   Collection Time: 03/12/20  7:18 AM  Result Value Ref Range   Glucose 99 65 - 99 mg/dL   BUN 22 6 - 24 mg/dL   Creatinine, Ser 1.25 0.76 - 1.27 mg/dL   GFR calc non Af Amer 66 >59 mL/min/1.73   GFR calc Af Amer 77 >59 mL/min/1.73   BUN/Creatinine Ratio 18 9 - 20   Sodium 146 (H) 134 - 144 mmol/L   Potassium 4.4 3.5 - 5.2 mmol/L   Chloride 101 96 - 106 mmol/L   CO2 26 20 - 29 mmol/L   Calcium 9.7 8.7 - 10.2 mg/dL   Total Protein 6.7 6.0 - 8.5 g/dL   Albumin 4.7 3.8 - 4.9 g/dL   Globulin, Total 2.0 1.5 - 4.5 g/dL   Albumin/Globulin Ratio 2.4 (H) 1.2 - 2.2   Bilirubin Total 0.5 0.0 - 1.2 mg/dL   Alkaline Phosphatase  107 39 - 117 IU/L   AST 34 0 - 40 IU/L   ALT 31 0 - 44 IU/L  Lipid panel     Status: Abnormal   Collection Time: 03/12/20  7:18 AM  Result Value Ref Range   Cholesterol, Total 227 (H) 100 - 199 mg/dL   Triglycerides 126 0 - 149 mg/dL   HDL 41 >39 mg/dL   VLDL Cholesterol Cal 23 5 - 40 mg/dL   LDL Chol Calc (NIH) 163 (H) 0 - 99 mg/dL   Chol/HDL Ratio 5.5 (H) 0.0 - 5.0 ratio    Comment:                                   T. Chol/HDL Ratio                                             Men  Women  1/2 Avg.Risk  3.4    3.3                                   Avg.Risk  5.0    4.4                                2X Avg.Risk  9.6    7.1                                3X Avg.Risk 23.4   11.0   CBC with Differential/Platelet     Status: None   Collection Time: 03/12/20  7:18 AM  Result Value Ref Range   WBC 5.1 3.4 - 10.8 x10E3/uL   RBC 5.26 4.14 - 5.80 x10E6/uL   Hemoglobin 15.9 13.0 - 17.7 g/dL   Hematocrit 46.9 37.5 - 51.0 %   MCV 89 79 - 97 fL   MCH 30.2 26.6 - 33.0 pg   MCHC 33.9 31.5 - 35.7 g/dL   RDW 13.3 11.6 - 15.4 %   Platelets 210 150 - 450 x10E3/uL   Neutrophils 58 Not Estab. %   Lymphs 28 Not Estab. %   Monocytes 8 Not Estab. %   Eos 4 Not Estab. %   Basos 2 Not Estab. %   Neutrophils Absolute 2.9 1.4 - 7.0 x10E3/uL   Lymphocytes Absolute 1.4 0.7 - 3.1 x10E3/uL   Monocytes Absolute 0.4 0.1 - 0.9 x10E3/uL   EOS (ABSOLUTE) 0.2 0.0 - 0.4 x10E3/uL   Basophils Absolute 0.1 0.0 - 0.2 x10E3/uL   Immature Granulocytes 0 Not Estab. %   Immature Grans (Abs) 0.0 0.0 - 0.1 x10E3/uL  TSH     Status: None   Collection Time: 03/12/20  7:18 AM  Result Value Ref Range   TSH 1.230 0.450 - 4.500 uIU/mL  Urinalysis, Routine w reflex microscopic     Status: Abnormal   Collection Time: 03/12/20  7:18 AM  Result Value Ref Range   Specific Gravity, UA      >=1.030 (A) 1.005 - 1.030   pH, UA 5.0 5.0 - 7.5   Color, UA Yellow Yellow   Appearance Ur Turbid (A) Clear    Leukocytes,UA Negative Negative   Protein,UA Negative Negative/Trace   Glucose, UA Negative Negative   Ketones, UA Negative Negative   RBC, UA Negative Negative   Bilirubin, UA Negative Negative   Urobilinogen, Ur 0.2 0.2 - 1.0 mg/dL   Nitrite, UA Negative Negative   Microscopic Examination Comment     Comment: Microscopic not indicated and not performed.  PSA     Status: None   Collection Time: 03/12/20  7:18 AM  Result Value Ref Range   Prostate Specific Ag, Serum 1.1 0.0 - 4.0 ng/mL    Comment: Roche ECLIA methodology. According to the American Urological Association, Serum PSA should decrease and remain at undetectable levels after radical prostatectomy. The AUA defines biochemical recurrence as an initial PSA value 0.2 ng/mL or greater followed by a subsequent confirmatory PSA value 0.2 ng/mL or greater. Values obtained with different assay methods or kits cannot be used interchangeably. Results cannot be interpreted as absolute evidence of the presence or absence of malignant disease.   Testosterone     Status: None   Collection Time: 03/12/20  7:18  AM  Result Value Ref Range   Testosterone 425 264 - 916 ng/dL    Comment: Adult male reference interval is based on a population of healthy nonobese males (BMI <30) between 23 and 51 years old. [place], [place] [phone]. PMID: 22633354.    Objective  Body mass index is 25.53 kg/m. Wt Readings from Last 3 Encounters:  03/16/20 158 lb 3.2 oz (71.8 kg)  02/27/20 155 lb (70.3 kg)  02/24/19 160 lb 12.8 oz (72.9 kg)   Temp Readings from Last 3 Encounters:  03/16/20 (!) 97.2 F (36.2 C) (Temporal)  02/27/20 97.8 F (36.6 C) (Oral)  02/24/19 97.7 F (36.5 C) (Oral)   BP Readings from Last 3 Encounters:  03/16/20 128/82  02/27/20 (!) 133/97  09/27/19 125/70   Pulse Readings from Last 3 Encounters:  03/16/20 69  02/27/20 62  02/24/19 70    Physical Exam Vitals and nursing note reviewed.   Constitutional:      Appearance: Normal appearance. He is well-developed and well-groomed.  HENT:     Head: Normocephalic and atraumatic.  Eyes:     Conjunctiva/sclera: Conjunctivae normal.     Pupils: Pupils are equal, round, and reactive to light.  Cardiovascular:     Rate and Rhythm: Normal rate and regular rhythm.     Heart sounds: Normal heart sounds. No murmur.  Pulmonary:     Effort: Pulmonary effort is normal.     Breath sounds: Normal breath sounds.  Abdominal:     General: Abdomen is flat. Bowel sounds are normal.     Tenderness: There is no abdominal tenderness.  Genitourinary:    Comments: Pt declines DRE Skin:    General: Skin is warm and dry.  Neurological:     General: No focal deficit present.     Mental Status: He is alert. Mental status is at baseline.     Gait: Gait normal.  Psychiatric:        Attention and Perception: Attention and perception normal.        Mood and Affect: Mood and affect normal.        Speech: Speech normal.        Behavior: Behavior normal. Behavior is cooperative.        Thought Content: Thought content normal.        Cognition and Memory: Cognition and memory normal.        Judgment: Judgment normal.     Assessment  Plan  Annual physical exam Flu shot had 10/24/2019  Check vxs (I.e Tdap) old PCPcheck date Consider shingrix vaccine Needs hep B and MMR vaccine covid 19 vx had 2/2   Check colonoscopy old PCP had x 2 dad h/o polypsprecancerous had age 47 and 50 , also had labs 07/2018 -no colonoscopy in recordsper pt had 2-3 colonoscopies in the past h/o polyp removed small  -pt will try to contact GI in [place] to get colonoscopy records   -? When due for colonoscopy f/uPCP records cant find last report but possibly had last colonoscopy age 19 with h/o polyps  Refer in future last colonoscopy reviewed  Dr. Alice Reichert will refer in future when records reviewed   DRE normal2/27/2020, declined DRE 03/16/20 PSA 1.1 03/12/20  Derm  not seen yet   Never smoker  rec healthy diet and exercise  Essential hypertension - Plan: losartan (COZAAR) 100 MG tablet increase from 50 mg qd , amLODipine (NORVASC) 2.5 MG tablet, Comprehensive metabolic panel, Lipid panel, Ambulatory referral to Cardiology Stop  hctz 12.5 mg qd makes him get orthostatic and dizzy at times  Log BP goal<130/<80  Hyperlipidemia, unspecified hyperlipidemia type - Plan: Lipid panel, Ambulatory referral to Cardiology on lipitor 10 mg qd   Anxiety and depression - Plan: buPROPion (WELLBUTRIN XL) 150 MG 24 hr tablet, hydrOXYzine (ATARAX/VISTARIL) 25 MG tablet, Ambulatory referral to Psychology osman  Chest pain, unspecified type - Plan: Ambulatory referral to Cardiology  Skin cancer screening - Plan: Ambulatory referral to Dermatology  Abnormal MRI, lumbar spine - Plan: Ambulatory referral to Physical Medicine Rehab Chronic low back pain, unspecified back pain laterality, unspecified whether sciatica present - Plan: Ambulatory referral to Physical Medicine Rehab Dr. Sharlet Salina  -->consideration could be made for medial branch blocks with radiofrequency neurotomy.  Dr. Sharlet Salina had appt 06/03/2019   Provider: Dr. Olivia Mackie McLean-Scocuzza-Internal Medicine

## 2020-03-21 ENCOUNTER — Encounter: Payer: Self-pay | Admitting: Internal Medicine

## 2020-03-21 DIAGNOSIS — R937 Abnormal findings on diagnostic imaging of other parts of musculoskeletal system: Secondary | ICD-10-CM | POA: Insufficient documentation

## 2020-03-21 DIAGNOSIS — F419 Anxiety disorder, unspecified: Secondary | ICD-10-CM | POA: Insufficient documentation

## 2020-03-21 DIAGNOSIS — F329 Major depressive disorder, single episode, unspecified: Secondary | ICD-10-CM | POA: Insufficient documentation

## 2020-03-29 ENCOUNTER — Ambulatory Visit: Payer: Managed Care, Other (non HMO) | Admitting: Cardiology

## 2020-04-03 ENCOUNTER — Telehealth: Payer: Self-pay | Admitting: Internal Medicine

## 2020-04-03 NOTE — Telephone Encounter (Signed)
Faxed request for pt's labs and vaccine card to Dr Ferol Luz. Signed release form sent to scan.   Awaiting response

## 2020-04-06 ENCOUNTER — Ambulatory Visit: Payer: Managed Care, Other (non HMO) | Admitting: Cardiology

## 2020-04-13 ENCOUNTER — Encounter: Payer: Self-pay | Admitting: Internal Medicine

## 2020-04-24 ENCOUNTER — Ambulatory Visit: Payer: Managed Care, Other (non HMO) | Admitting: Cardiology

## 2020-04-30 ENCOUNTER — Encounter: Payer: Self-pay | Admitting: Internal Medicine

## 2020-04-30 NOTE — Telephone Encounter (Signed)
Did you receive these records?

## 2020-04-30 NOTE — Telephone Encounter (Signed)
I need Tdap and most recent colonoscopy still   Thanks Ciales

## 2020-05-01 NOTE — Telephone Encounter (Addendum)
Spoke with Delsa Sale [phone]). She states they never received the signed release form and we will need to re-fax this.  Phone: [phone] Fax: [phone]  Form was sent to scan but has not been placed in the system. Will have to have the patient come back in to sign another form.

## 2020-05-07 NOTE — Telephone Encounter (Signed)
Left message to return call. Needing the patient to come in to sign a release of information as this has not been scanned into his chart.  Will also send a mychart message.

## 2020-05-14 ENCOUNTER — Other Ambulatory Visit: Payer: Self-pay | Admitting: Internal Medicine

## 2020-05-14 DIAGNOSIS — F419 Anxiety disorder, unspecified: Secondary | ICD-10-CM

## 2020-05-14 MED ORDER — HYDROXYZINE HCL 25 MG PO TABS: 25.0000 mg | ORAL_TABLET | Freq: Every evening | ORAL | 0 refills | Status: DC | PRN

## 2020-05-15 ENCOUNTER — Ambulatory Visit: Payer: Managed Care, Other (non HMO) | Admitting: Cardiology

## 2020-05-17 ENCOUNTER — Ambulatory Visit (INDEPENDENT_AMBULATORY_CARE_PROVIDER_SITE_OTHER): Payer: Managed Care, Other (non HMO)

## 2020-05-17 ENCOUNTER — Ambulatory Visit (INDEPENDENT_AMBULATORY_CARE_PROVIDER_SITE_OTHER): Payer: Managed Care, Other (non HMO) | Admitting: Internal Medicine

## 2020-05-17 ENCOUNTER — Telehealth: Payer: Self-pay | Admitting: Internal Medicine

## 2020-05-17 ENCOUNTER — Encounter: Payer: Self-pay | Admitting: Internal Medicine

## 2020-05-17 ENCOUNTER — Other Ambulatory Visit: Payer: Self-pay

## 2020-05-17 VITALS — BP 100/70 | HR 80 | Temp 97.9°F | Ht 66.0 in | Wt 158.2 lb

## 2020-05-17 DIAGNOSIS — M47812 Spondylosis without myelopathy or radiculopathy, cervical region: Secondary | ICD-10-CM

## 2020-05-17 DIAGNOSIS — M542 Cervicalgia: Secondary | ICD-10-CM

## 2020-05-17 DIAGNOSIS — I10 Essential (primary) hypertension: Secondary | ICD-10-CM | POA: Diagnosis not present

## 2020-05-17 DIAGNOSIS — Z125 Encounter for screening for malignant neoplasm of prostate: Secondary | ICD-10-CM

## 2020-05-17 DIAGNOSIS — Z1389 Encounter for screening for other disorder: Secondary | ICD-10-CM

## 2020-05-17 DIAGNOSIS — Z0000 Encounter for general adult medical examination without abnormal findings: Secondary | ICD-10-CM

## 2020-05-17 DIAGNOSIS — Z13818 Encounter for screening for other digestive system disorders: Secondary | ICD-10-CM

## 2020-05-17 DIAGNOSIS — M5412 Radiculopathy, cervical region: Secondary | ICD-10-CM | POA: Diagnosis not present

## 2020-05-17 DIAGNOSIS — Z1211 Encounter for screening for malignant neoplasm of colon: Secondary | ICD-10-CM

## 2020-05-17 MED ORDER — CYCLOBENZAPRINE HCL 5 MG PO TABS: 5.0000 mg | ORAL_TABLET | Freq: Every evening | ORAL | 2 refills | Status: DC | PRN

## 2020-05-17 NOTE — Progress Notes (Signed)
Chief Complaint  Patient presents with  . Back Pain  . Neck Pain   F/u  1. HTN improved on norvasc 2.5 mg qd and losartan 100 mg qd no signs if presyncope since stopped hctz  2. Reason for visit 5/10 lower neck pain since Sat and Friday played golf but no sx's pain is worse at base of neck and lying down and getting up very painful and some pain with ROM at times 7/10 and pushing forehead/face forward makes pain worse nothing tried    Review of Systems  Constitutional: Negative for weight loss.  HENT: Negative for hearing loss.   Eyes: Negative for blurred vision.  Respiratory: Negative for shortness of breath.   Cardiovascular: Negative for chest pain.  Gastrointestinal: Negative for abdominal pain.  Musculoskeletal: Positive for neck pain.  Skin: Negative for rash.  Neurological: Negative for dizziness.  Psychiatric/Behavioral: Negative for depression and memory loss.   Past Medical History:  Diagnosis Date  . Allergy   . Chicken pox   . Colon polyps   . Gastritis    with esophagitis   . GERD (gastroesophageal reflux disease)   . Hyperlipidemia   . Hypertension   . Low back pain    Past Surgical History:  Procedure Laterality Date  . APPENDECTOMY    . COLONOSCOPY W/ POLYPECTOMY    . KNEE SURGERY     arthroscopy x 2 torn cartilage   . LASIK     b/l  . TONSILLECTOMY     Family History  Problem Relation Age of Onset  . Diabetes Mother   . Hypertension Mother   . Heart attack Mother        mid 30s  . Diabetes Father   . Hypertension Father   . Hyperlipidemia Father   . Colon polyps Father        precancerous   . Diabetes Maternal Grandmother   . Heart disease Maternal Grandfather        MI age 29s-50s   . Hyperlipidemia Paternal Grandmother   . Valvular heart disease Paternal Grandmother   . Diabetes Paternal Grandfather   . Cancer Paternal Grandfather        lung cancer smoker   . Heart attack Paternal Grandfather        in age 31s    Social History    Socioeconomic History  . Marital status: Married    Spouse name: Not on file  . Number of children: Not on file  . Years of education: Not on file  . Highest education level: Not on file  Occupational History  . Not on file  Tobacco Use  . Smoking status: Never Smoker  . Smokeless tobacco: Never Used  Substance and Sexual Activity  . Alcohol use: Yes  . Drug use: Not Currently  . Sexual activity: Yes  Other Topics Concern  . Not on file  Social History Narrative   Married    Moved from [place] in [place] x2   No guns   Wears seat belt    Safe in relationship    Social Determinants of Health   Financial Resource Strain:   . Difficulty of Paying Living Expenses:   Food Insecurity:   . Worried About Charity fundraiser in the Last Year:   . Arboriculturist in the Last Year:   Transportation Needs:   . Film/video editor (Medical):   Marland Kitchen Lack of Transportation (Non-Medical):  Physical Activity:   . Days of Exercise per Week:   . Minutes of Exercise per Session:   Stress:   . Feeling of Stress :   Social Connections:   . Frequency of Communication with Friends and Family:   . Frequency of Social Gatherings with Friends and Family:   . Attends Religious Services:   . Active Member of Clubs or Organizations:   . Attends Archivist Meetings:   Marland Kitchen Marital Status:   Intimate Partner Violence:   . Fear of Current or Ex-Partner:   . Emotionally Abused:   Marland Kitchen Physically Abused:   . Sexually Abused:    Current Meds  Medication Sig  . amLODipine (NORVASC) 2.5 MG tablet Take 1 tablet (2.5 mg total) by mouth daily. For BP >130/>80  . Ascorbic Acid (VITAMIN C PO) Take by mouth.  Marland Kitchen atorvastatin (LIPITOR) 10 MG tablet Take 1 tablet (10 mg total) by mouth daily at 6 PM.  . fexofenadine (ALLEGRA) 180 MG tablet Take 180 mg by mouth daily as needed for allergies or rhinitis.  . hydrOXYzine (ATARAX/VISTARIL) 25 MG tablet Take 1-2 tablets (25-50 mg  total) by mouth at bedtime as needed. Or BID prn  . losartan (COZAAR) 100 MG tablet Take 1 tablet (100 mg total) by mouth daily.  Marland Kitchen LYSINE PO Take by mouth.  Marland Kitchen MAGNESIUM PO Take by mouth.  . Multiple Vitamin (MULTIVITAMIN) capsule Take 1 capsule by mouth daily.  . Multiple Vitamins-Minerals (ZINC PO) Take by mouth.  . pantoprazole (PROTONIX) 20 MG tablet Take 1 tablet (20 mg total) by mouth daily. 30 minutes before food  . sildenafil (REVATIO) 20 MG tablet Take 1-5 tablets (20-100 mg total) by mouth daily as needed.   Allergies  Allergen Reactions  . Hctz [Hydrochlorothiazide]     Dizziness    . Penicillins Rash    Rash as chid but able to tolerate amoxicillin    Recent Results (from the past 2160 hour(s))  Basic metabolic panel     Status: None   Collection Time: 02/27/20  4:20 PM  Result Value Ref Range   Sodium 140 135 - 145 mmol/L   Potassium 4.2 3.5 - 5.1 mmol/L   Chloride 102 98 - 111 mmol/L   CO2 32 22 - 32 mmol/L   Glucose, Bld 96 70 - 99 mg/dL    Comment: Glucose reference range applies only to samples taken after fasting for at least 8 hours.   BUN 17 6 - 20 mg/dL   Creatinine, Ser 1.14 0.61 - 1.24 mg/dL   Calcium 9.5 8.9 - 10.3 mg/dL   GFR calc non Af Amer >60 >60 mL/min   GFR calc Af Amer >60 >60 mL/min   Anion gap 6 5 - 15    Comment: Performed at [facility], [address] [place], [place] [address]  CBC     Status: None   Collection Time: 02/27/20  4:20 PM  Result Value Ref Range   WBC 6.6 4.0 - 10.5 K/uL   RBC 5.43 4.22 - 5.81 MIL/uL   Hemoglobin 16.0 13.0 - 17.0 g/dL   HCT 46.5 39.0 - 52.0 %   MCV 85.6 80.0 - 100.0 fL   MCH 29.5 26.0 - 34.0 pg   MCHC 34.4 30.0 - 36.0 g/dL   RDW 13.0 11.5 - 15.5 %   Platelets 231 150 - 400 K/uL   nRBC 0.0 0.0 - 0.2 %    Comment: Performed at [facility] [address], [place] [address]  Troponin I (High Sensitivity)     Status: None   Collection Time: 02/27/20  4:20 PM  Result Value  Ref Range   Troponin I (High Sensitivity) <2 <18 ng/L    Comment: (NOTE) Elevated high sensitivity troponin I (hsTnI) values and significant  changes across serial measurements may suggest ACS but many other  chronic and acute conditions are known to elevate hsTnI results.  Refer to the "Links" section for chest pain algorithms and additional  guidance. Performed at [facility], [place], [place] [address]   Troponin I (High Sensitivity)     Status: None   Collection Time: 02/27/20  6:34 PM  Result Value Ref Range   Troponin I (High Sensitivity) <2 <18 ng/L    Comment: (NOTE) Elevated high sensitivity troponin I (hsTnI) values and significant  changes across serial measurements may suggest ACS but many other  chronic and acute conditions are known to elevate hsTnI results.  Refer to the "Links" section for chest pain algorithms and additional  guidance. Performed at [facility], [address] [place], [place] [address]   Comprehensive metabolic panel     Status: Abnormal   Collection Time: 03/12/20  7:18 AM  Result Value Ref Range   Glucose 99 65 - 99 mg/dL   BUN 22 6 - 24 mg/dL   Creatinine, Ser 1.25 0.76 - 1.27 mg/dL   GFR calc non Af Amer 66 >59 mL/min/1.73   GFR calc Af Amer 77 >59 mL/min/1.73   BUN/Creatinine Ratio 18 9 - 20   Sodium 146 (H) 134 - 144 mmol/L   Potassium 4.4 3.5 - 5.2 mmol/L   Chloride 101 96 - 106 mmol/L   CO2 26 20 - 29 mmol/L   Calcium 9.7 8.7 - 10.2 mg/dL   Total Protein 6.7 6.0 - 8.5 g/dL   Albumin 4.7 3.8 - 4.9 g/dL   Globulin, Total 2.0 1.5 - 4.5 g/dL   Albumin/Globulin Ratio 2.4 (H) 1.2 - 2.2   Bilirubin Total 0.5 0.0 - 1.2 mg/dL   Alkaline Phosphatase 107 39 - 117 IU/L   AST 34 0 - 40 IU/L   ALT 31 0 - 44 IU/L  Lipid panel     Status: Abnormal   Collection Time: 03/12/20  7:18 AM  Result Value Ref Range   Cholesterol, Total 227 (H) 100 - 199 mg/dL   Triglycerides 126 0 - 149 mg/dL   HDL 41 >39  mg/dL   VLDL Cholesterol Cal 23 5 - 40 mg/dL   LDL Chol Calc (NIH) 163 (H) 0 - 99 mg/dL   Chol/HDL Ratio 5.5 (H) 0.0 - 5.0 ratio    Comment:                                   T. Chol/HDL Ratio                                             Men  Women                               1/2 Avg.Risk  3.4    3.3  Avg.Risk  5.0    4.4                                2X Avg.Risk  9.6    7.1                                3X Avg.Risk 23.4   11.0   CBC with Differential/Platelet     Status: None   Collection Time: 03/12/20  7:18 AM  Result Value Ref Range   WBC 5.1 3.4 - 10.8 x10E3/uL   RBC 5.26 4.14 - 5.80 x10E6/uL   Hemoglobin 15.9 13.0 - 17.7 g/dL   Hematocrit 46.9 37.5 - 51.0 %   MCV 89 79 - 97 fL   MCH 30.2 26.6 - 33.0 pg   MCHC 33.9 31.5 - 35.7 g/dL   RDW 13.3 11.6 - 15.4 %   Platelets 210 150 - 450 x10E3/uL   Neutrophils 58 Not Estab. %   Lymphs 28 Not Estab. %   Monocytes 8 Not Estab. %   Eos 4 Not Estab. %   Basos 2 Not Estab. %   Neutrophils Absolute 2.9 1.4 - 7.0 x10E3/uL   Lymphocytes Absolute 1.4 0.7 - 3.1 x10E3/uL   Monocytes Absolute 0.4 0.1 - 0.9 x10E3/uL   EOS (ABSOLUTE) 0.2 0.0 - 0.4 x10E3/uL   Basophils Absolute 0.1 0.0 - 0.2 x10E3/uL   Immature Granulocytes 0 Not Estab. %   Immature Grans (Abs) 0.0 0.0 - 0.1 x10E3/uL  TSH     Status: None   Collection Time: 03/12/20  7:18 AM  Result Value Ref Range   TSH 1.230 0.450 - 4.500 uIU/mL  Urinalysis, Routine w reflex microscopic     Status: Abnormal   Collection Time: 03/12/20  7:18 AM  Result Value Ref Range   Specific Gravity, UA      >=1.030 (A) 1.005 - 1.030   pH, UA 5.0 5.0 - 7.5   Color, UA Yellow Yellow   Appearance Ur Turbid (A) Clear   Leukocytes,UA Negative Negative   Protein,UA Negative Negative/Trace   Glucose, UA Negative Negative   Ketones, UA Negative Negative   RBC, UA Negative Negative   Bilirubin, UA Negative Negative   Urobilinogen, Ur 0.2 0.2 - 1.0 mg/dL   Nitrite,  UA Negative Negative   Microscopic Examination Comment     Comment: Microscopic not indicated and not performed.  PSA     Status: None   Collection Time: 03/12/20  7:18 AM  Result Value Ref Range   Prostate Specific Ag, Serum 1.1 0.0 - 4.0 ng/mL    Comment: Roche ECLIA methodology. According to the American Urological Association, Serum PSA should decrease and remain at undetectable levels after radical prostatectomy. The AUA defines biochemical recurrence as an initial PSA value 0.2 ng/mL or greater followed by a subsequent confirmatory PSA value 0.2 ng/mL or greater. Values obtained with different assay methods or kits cannot be used interchangeably. Results cannot be interpreted as absolute evidence of the presence or absence of malignant disease.   Testosterone     Status: None   Collection Time: 03/12/20  7:18 AM  Result Value Ref Range   Testosterone 425 264 - 916 ng/dL    Comment: Adult male reference interval is based on a population of healthy nonobese males (BMI <30) between 27 and 45 years old. Travison, et.al. Ilda Basset  2017,102;1161-1173. PMID: 66060045.    Objective  Body mass index is 25.53 kg/m. Wt Readings from Last 3 Encounters:  05/17/20 158 lb 3.2 oz (71.8 kg)  03/16/20 158 lb 3.2 oz (71.8 kg)  02/27/20 155 lb (70.3 kg)   Temp Readings from Last 3 Encounters:  05/17/20 97.9 F (36.6 C) (Temporal)  03/16/20 (!) 97.2 F (36.2 C) (Temporal)  02/27/20 97.8 F (36.6 C) (Oral)   BP Readings from Last 3 Encounters:  05/17/20 100/70  03/16/20 128/82  02/27/20 (!) 133/97   Pulse Readings from Last 3 Encounters:  05/17/20 80  03/16/20 69  02/27/20 62    Physical Exam Vitals and nursing note reviewed.  Constitutional:      Appearance: Normal appearance. He is well-developed and well-groomed.  HENT:     Head: Normocephalic and atraumatic.  Eyes:     Conjunctiva/sclera: Conjunctivae normal.     Pupils: Pupils are equal, round, and reactive to light.    Cardiovascular:     Rate and Rhythm: Normal rate and regular rhythm.     Heart sounds: Normal heart sounds. No murmur.  Pulmonary:     Effort: Pulmonary effort is normal.     Breath sounds: Normal breath sounds.  Musculoskeletal:     Cervical back: Tenderness present.       Back:  Skin:    General: Skin is warm and dry.  Neurological:     General: No focal deficit present.     Mental Status: He is alert and oriented to person, place, and time. Mental status is at baseline.     Gait: Gait normal.  Psychiatric:        Attention and Perception: Attention and perception normal.        Mood and Affect: Mood and affect normal.        Speech: Speech normal.        Behavior: Behavior normal. Behavior is cooperative.        Thought Content: Thought content normal.        Cognition and Memory: Cognition and memory normal.        Judgment: Judgment normal.     Assessment  Plan  Arthritis of neck Neck pain - Plan: DG Cervical Spine Complete, cyclobenzaprine (FLEXERIL) 5 MG tablet Pt may come back for depomedrol shot  Consider PT/MRI C spine in future  Disc tylenol voltaren gel  Essential hypertension  Cont meds  If needed norvasc 2.5 can take bid   HM Flu shot had 10/24/2019  Check vxs (I.e Tdap) old PCPcheck date Consider shingrix vaccine Needs hep B and MMR vaccine covid 19 vx had 2/2   Check colonoscopy old PCP had x 2 dad h/o polypsprecancerous had age 18 and 1 , also had labs 07/2018 -no colonoscopy in recordsper pt had 2-3 colonoscopies in the past h/o polyp removed small  -pt will try to contact GI in [place] to get colonoscopy records   -? When due for colonoscopy f/uPCP records cant find last report but possibly had last colonoscopy age 78 with h/o polyps  Refer in future last colonoscopy reviewed  Dr. Alice Reichert will refer in future when records reviewed   DRE normal2/27/2020, declined DRE 03/16/20 PSA 1.1 03/12/20  Derm not seen yet   Never smoker  rec  healthy diet and exercise   Provider: Dr. Olivia Mackie McLean-Scocuzza-Internal Medicine

## 2020-05-17 NOTE — Patient Instructions (Addendum)
voltaren gel or aspercream or bengay Lidocaine pain patch Heat  Consider depomedrol 40 mg shot can schedule Monday-Thursday in am  As needed Tylenol  Marica Otter Tumeric/Ginger/Curcumin    Norvasc 2.5 mg daily or Amlodipine can take up to 2x per day if needed if BP >130/>80 goal <130/<80   Cervical Strain and Sprain Rehab Ask your health care provider which exercises are safe for you. Do exercises exactly as told by your health care provider and adjust them as directed. It is normal to feel mild stretching, pulling, tightness, or discomfort as you do these exercises. Stop right away if you feel sudden pain or your pain gets worse. Do not begin these exercises until told by your health care provider. Stretching and range-of-motion exercises Cervical side bending  1. Using good posture, sit on a stable chair or stand up. 2. Without moving your shoulders, slowly tilt your left / right ear to your shoulder until you feel a stretch in the opposite side neck muscles. You should be looking straight ahead. 3. Hold for __________ seconds. 4. Repeat with the other side of your neck. Repeat __________ times. Complete this exercise __________ times a day. Cervical rotation  1. Using good posture, sit on a stable chair or stand up. 2. Slowly turn your head to the side as if you are looking over your left / right shoulder. ? Keep your eyes level with the ground. ? Stop when you feel a stretch along the side and the back of your neck. 3. Hold for __________ seconds. 4. Repeat this by turning to your other side. Repeat __________ times. Complete this exercise __________ times a day. Thoracic extension and pectoral stretch 1. Roll a towel or a small blanket so it is about 4 inches (10 cm) in diameter. 2. Lie down on your back on a firm surface. 3. Put the towel lengthwise, under your spine in the middle of your back. It should not be under your shoulder blades. The towel should line up with your  spine from your middle back to your lower back. 4. Put your hands behind your head and let your elbows fall out to your sides. 5. Hold for __________ seconds. Repeat __________ times. Complete this exercise __________ times a day. Strengthening exercises Isometric upper cervical flexion 1. Lie on your back with a thin pillow behind your head and a small rolled-up towel under your neck. 2. Gently tuck your chin toward your chest and nod your head down to look toward your feet. Do not lift your head off the pillow. 3. Hold for __________ seconds. 4. Release the tension slowly. Relax your neck muscles completely before you repeat this exercise. Repeat __________ times. Complete this exercise __________ times a day. Isometric cervical extension  1. Stand about 6 inches (15 cm) away from a wall, with your back facing the wall. 2. Place a soft object, about 6-8 inches (15-20 cm) in diameter, between the back of your head and the wall. A soft object could be a small pillow, a ball, or a folded towel. 3. Gently tilt your head back and press into the soft object. Keep your jaw and forehead relaxed. 4. Hold for __________ seconds. 5. Release the tension slowly. Relax your neck muscles completely before you repeat this exercise. Repeat __________ times. Complete this exercise __________ times a day. Posture and body mechanics Body mechanics refers to the movements and positions of your body while you do your daily activities. Posture is part of body mechanics.  Good posture and healthy body mechanics can help to relieve stress in your body's tissues and joints. Good posture means that your spine is in its natural S-curve position (your spine is neutral), your shoulders are pulled back slightly, and your head is not tipped forward. The following are general guidelines for applying improved posture and body mechanics to your everyday activities. Sitting  1. When sitting, keep your spine neutral and keep  your feet flat on the floor. Use a footrest, if necessary, and keep your thighs parallel to the floor. Avoid rounding your shoulders, and avoid tilting your head forward. 2. When working at a desk or a computer, keep your desk at a height where your hands are slightly lower than your elbows. Slide your chair under your desk so you are close enough to maintain good posture. 3. When working at a computer, place your monitor at a height where you are looking straight ahead and you do not have to tilt your head forward or downward to look at the screen. Standing   When standing, keep your spine neutral and keep your feet about hip-width apart. Keep a slight bend in your knees. Your ears, shoulders, and hips should line up.  When you do a task in which you stand in one place for a long time, place one foot up on a stable object that is 2-4 inches (5-10 cm) high, such as a footstool. This helps keep your spine neutral. Resting When lying down and resting, avoid positions that are most painful for you. Try to support your neck in a neutral position. You can use a contour pillow or a small rolled-up towel. Your pillow should support your neck but not push on it. This information is not intended to replace advice given to you by your health care provider. Make sure you discuss any questions you have with your health care provider. Document Revised: 04/06/2019 Document Reviewed: 09/15/2018 Elsevier Patient Education  [place].  Neck Exercises Ask your health care provider which exercises are safe for you. Do exercises exactly as told by your health care provider and adjust them as directed. It is normal to feel mild stretching, pulling, tightness, or discomfort as you do these exercises. Stop right away if you feel sudden pain or your pain gets worse. Do not begin these exercises until told by your health care provider. Neck exercises can be important for many reasons. They can improve strength and  maintain flexibility in your neck, which will help your upper back and prevent neck pain. Stretching exercises Rotation neck stretching  1. Sit in a chair or stand up. 2. Place your feet flat on the floor, shoulder width apart. 3. Slowly turn your head (rotate) to the right until a slight stretch is felt. Turn it all the way to the right so you can look over your right shoulder. Do not tilt or tip your head. 4. Hold this position for 10-30 seconds. 5. Slowly turn your head (rotate) to the left until a slight stretch is felt. Turn it all the way to the left so you can look over your left shoulder. Do not tilt or tip your head. 6. Hold this position for 10-30 seconds. Repeat __________ times. Complete this exercise __________ times a day. Neck retraction 1. Sit in a sturdy chair or stand up. 2. Look straight ahead. Do not bend your neck. 3. Use your fingers to push your chin backward (retraction). Do not bend your neck for this movement. Continue  to face straight ahead. If you are doing the exercise properly, you will feel a slight sensation in your throat and a stretch at the back of your neck. 4. Hold the stretch for 1-2 seconds. Repeat __________ times. Complete this exercise __________ times a day. Strengthening exercises Neck press 1. Lie on your back on a firm bed or on the floor with a pillow under your head. 2. Use your neck muscles to push your head down on the pillow and straighten your spine. 3. Hold the position as well as you can. Keep your head facing up (in a neutral position) and your chin tucked. 4. Slowly count to 5 while holding this position. Repeat __________ times. Complete this exercise __________ times a day. Isometrics These are exercises in which you strengthen the muscles in your neck while keeping your neck still (isometrics). 1. Sit in a supportive chair and place your hand on your forehead. 2. Keep your head and face facing straight ahead. Do not flex or extend  your neck while doing isometrics. 3. Push forward with your head and neck while pushing back with your hand. Hold for 10 seconds. 4. Do the sequence again, this time putting your hand against the back of your head. Use your head and neck to push backward against the hand pressure. 5. Finally, do the same exercise on either side of your head, pushing sideways against the pressure of your hand. Repeat __________ times. Complete this exercise __________ times a day. Prone head lifts 1. Lie face-down (prone position), resting on your elbows so that your chest and upper back are raised. 2. Start with your head facing downward, near your chest. Position your chin either on or near your chest. 3. Slowly lift your head upward. Lift until you are looking straight ahead. Then continue lifting your head as far back as you can comfortably stretch. 4. Hold your head up for 5 seconds. Then slowly lower it to your starting position. Repeat __________ times. Complete this exercise __________ times a day. Supine head lifts 1. Lie on your back (supine position), bending your knees to point to the ceiling and keeping your feet flat on the floor. 2. Lift your head slowly off the floor, raising your chin toward your chest. 3. Hold for 5 seconds. Repeat __________ times. Complete this exercise __________ times a day. Scapular retraction 1. Stand with your arms at your sides. Look straight ahead. 2. Slowly pull both shoulders (scapulae) backward and downward (retraction) until you feel a stretch between your shoulder blades in your upper back. 3. Hold for 10-30 seconds. 4. Relax and repeat. Repeat __________ times. Complete this exercise __________ times a day. Contact a health care provider if:  Your neck pain or discomfort gets much worse when you do an exercise.  Your neck pain or discomfort does not improve within 2 hours after you exercise. If you have any of these problems, stop exercising right away. Do  not do the exercises again unless your health care provider says that you can. Get help right away if:  You develop sudden, severe neck pain. If this happens, stop exercising right away. Do not do the exercises again unless your health care provider says that you can. This information is not intended to replace advice given to you by your health care provider. Make sure you discuss any questions you have with your health care provider. Document Revised: 10/13/2018 Document Reviewed: 10/13/2018 Elsevier Patient Education  [place].  Cervical Sprain  A  cervical sprain is a stretch or tear in one or more of the tough, cord-like tissues that connect bones (ligaments) in the neck. Cervical sprains can range from mild to severe. Severe cervical sprains can cause the spinal bones (vertebrae) in the neck to be unstable. This can lead to spinal cord damage and can result in serious nervous system problems. The amount of time that it takes for a cervical sprain to get better depends on the cause and extent of the injury. Most cervical sprains heal in 4-6 weeks. What are the causes? Cervical sprains may be caused by an injury (trauma), such as from a motor vehicle accident, a fall, or sudden forward and backward whipping movement of the head and neck (whiplash injury). Mild cervical sprains may be caused by wear and tear over time, such as from poor posture, sitting in a chair that does not provide support, or looking up or down for long periods of time. What increases the risk? The following factors may make you more likely to develop this condition:  Participating in activities that have a high risk of trauma to the neck. These include contact sports, auto racing, gymnastics, and diving.  Taking risks when driving or riding in a motor vehicle, such as speeding.  Having osteoarthritis of the spine.  Having poor strength and flexibility of the neck.  A previous neck injury.  Having poor  posture.  Spending a lot of time in certain positions that put stress on the neck, such as sitting at a computer for long periods of time. What are the signs or symptoms? Symptoms of this condition include:  Pain, soreness, stiffness, tenderness, swelling, or a burning sensation in the front, back, or sides of the neck.  Sudden tightening of neck muscles that you cannot control (muscle spasms).  Pain in the shoulders or upper back.  Limited ability to move the neck.  Headache.  Dizziness.  Nausea.  Vomiting.  Weakness, numbness, or tingling in a hand or an arm. Symptoms may develop right away after injury, or they may develop over a few days. In some cases, symptoms may go away with treatment and return (recur) over time. How is this diagnosed? This condition may be diagnosed based on:  Your medical history.  Your symptoms.  Any recent injuries or known neck problems that you have, such as arthritis in the neck.  A physical exam.  Imaging tests, such as: ? X-rays. ? MRI. ? CT scan. How is this treated? This condition is treated by resting and icing the injured area and doing physical therapy exercises. Depending on the severity of your condition, treatment may also include:  Keeping your neck in place (immobilized) for periods of time. This may be done using: ? A cervical collar. This supports your chin and the back of your head. ? A cervical traction device. This is a sling that holds up your head. This removes weight and pressure from your neck, and it may help to relieve pain.  Medicines that help to relieve pain and inflammation.  Medicines that help to relax your muscles (muscle relaxants).  Surgery. This is rare. Follow these instructions at home: If you have a cervical collar:   Wear it as told by your health care provider. Do not remove the collar unless instructed by your health care provider.  Ask your health care provider before you make any  adjustments to your collar.  If you have long hair, keep it outside of the collar.  Ask  your health care provider if you can remove the collar for cleaning and bathing. If you are allowed to remove the collar for cleaning or bathing: ? Follow instructions from your health care provider about how to remove the collar safely. ? Clean the collar by wiping it with mild soap and water and drying it completely. ? If your collar has removable pads, remove them every 1-2 days and wash them by hand with soap and water. Let them air-dry completely before you put them back in the collar. ? Check your skin under the collar for irritation or sores. If you see any, tell your health care provider. Managing pain, stiffness, and swelling   If directed, use a cervical traction device as told by your health care provider.  If directed, apply heat to the affected area before you do your physical therapy or as often as told by your health care provider. Use the heat source that your health care provider recommends, such as a moist heat pack or a heating pad. ? Place a towel between your skin and the heat source. ? Leave the heat on for 20-30 minutes. ? Remove the heat if your skin turns bright red. This is especially important if you are unable to feel pain, heat, or cold. You may have a greater risk of getting burned.  If directed, put ice on the affected area: ? Put ice in a plastic bag. ? Place a towel between your skin and the bag. ? Leave the ice on for 20 minutes, 2-3 times a day. Activity  Do not drive while wearing a cervical collar. If you do not have a cervical collar, ask your health care provider if it is safe to drive while your neck heals.  Do not drive or use heavy machinery while taking prescription pain medicine or muscle relaxants, unless your health care provider approves.  Do not lift anything that is heavier than 10 lb (4.5 kg) until your health care provider tells you that it is  safe.  Rest as directed by your health care provider. Avoid positions and activities that make your symptoms worse. Ask your health care provider what activities are safe for you.  If physical therapy was prescribed, do exercises as told by your health care provider or physical therapist. General instructions  Take over-the-counter and prescription medicines only as told by your health care provider.  Do not use any products that contain nicotine or tobacco, such as cigarettes and e-cigarettes. These can delay healing. If you need help quitting, ask your health care provider.  Keep all follow-up visits as told by your health care provider or physical therapist. This is important. How is this prevented? To prevent a cervical sprain from happening again:  Use and maintain good posture. Make any needed adjustments to your workstation to help you use good posture.  Exercise regularly as directed by your health care provider or physical therapist.  Avoid risky activities that may cause a cervical sprain. Contact a health care provider if:  You have symptoms that get worse or do not get better after 2 weeks of treatment.  You have pain that gets worse or does not get better with medicine.  You develop new, unexplained symptoms.  You have sores or irritated skin on your neck from wearing your cervical collar. Get help right away if:  You have severe pain.  You develop numbness, tingling, or weakness in any part of your body.  You cannot move a part of your  body (you have paralysis).  You have neck pain along with: ? Severe dizziness. ? Headache. Summary  A cervical sprain is a stretch or tear in one or more of the tough, cord-like tissues that connect bones (ligaments) in the neck.  Cervical sprains may be caused by an injury (trauma), such as from a motor vehicle accident, a fall, or sudden forward and backward whipping movement of the head and neck (whiplash  injury).  Symptoms may develop right away after injury, or they may develop over a few days.  This condition is treated by resting and icing the injured area and doing physical therapy exercises. This information is not intended to replace advice given to you by your health care provider. Make sure you discuss any questions you have with your health care provider. Document Revised: 04/06/2019 Document Reviewed: 08/13/2016 Elsevier Patient Education  [place].

## 2020-05-17 NOTE — Telephone Encounter (Signed)
Patient seen in office today and signed a new release of information. This has been placed to fax and a copy will be sent to scan once fax confirmation has been received.

## 2020-05-18 ENCOUNTER — Encounter: Payer: Self-pay | Admitting: Internal Medicine

## 2020-05-18 DIAGNOSIS — M47812 Spondylosis without myelopathy or radiculopathy, cervical region: Secondary | ICD-10-CM | POA: Insufficient documentation

## 2020-05-18 NOTE — Telephone Encounter (Signed)
-----   Message from Delorise Jackson, MD sent at 05/18/2020  8:29 AM EDT ----- Arthritis and loss of height at C5/C6 neck bones  Narrowing in the neck as well   Consider PT and or MRI neck if future if not better

## 2020-05-18 NOTE — Addendum Note (Signed)
Addended by: Orland Mustard on: 05/18/2020 04:57 PM   Modules accepted: Orders

## 2020-05-22 ENCOUNTER — Encounter: Payer: Self-pay | Admitting: Internal Medicine

## 2020-05-22 DIAGNOSIS — N529 Male erectile dysfunction, unspecified: Secondary | ICD-10-CM

## 2020-05-22 MED ORDER — SILDENAFIL CITRATE 20 MG PO TABS: 20.0000 mg | ORAL_TABLET | Freq: Every day | ORAL | 11 refills | Status: DC | PRN

## 2020-06-13 ENCOUNTER — Telehealth: Payer: Self-pay | Admitting: Internal Medicine

## 2020-06-13 ENCOUNTER — Other Ambulatory Visit: Payer: Self-pay

## 2020-06-13 DIAGNOSIS — E785 Hyperlipidemia, unspecified: Secondary | ICD-10-CM

## 2020-06-13 LAB — COMPREHENSIVE METABOLIC PANEL
ALT: 34 IU/L (ref 0–44)
AST: 26 IU/L (ref 0–40)
Albumin/Globulin Ratio: 2.5 — ABNORMAL HIGH (ref 1.2–2.2)
Albumin: 4.9 g/dL (ref 3.8–4.9)
Alkaline Phosphatase: 112 IU/L (ref 48–121)
BUN/Creatinine Ratio: 14 (ref 9–20)
BUN: 16 mg/dL (ref 6–24)
Bilirubin Total: 0.8 mg/dL (ref 0.0–1.2)
CO2: 23 mmol/L (ref 20–29)
Calcium: 9.7 mg/dL (ref 8.7–10.2)
Chloride: 103 mmol/L (ref 96–106)
Creatinine, Ser: 1.15 mg/dL (ref 0.76–1.27)
GFR calc Af Amer: 85 mL/min/{1.73_m2} (ref >59)
GFR calc non Af Amer: 73 mL/min/{1.73_m2} (ref >59)
Globulin, Total: 2 g/dL (ref 1.5–4.5)
Glucose: 94 mg/dL (ref 65–99)
Potassium: 4.9 mmol/L (ref 3.5–5.2)
Sodium: 142 mmol/L (ref 134–144)
Total Protein: 6.9 g/dL (ref 6.0–8.5)

## 2020-06-13 LAB — LIPID PANEL
Chol/HDL Ratio: 3.6 ratio (ref 0.0–5.0)
Cholesterol, Total: 151 mg/dL (ref 100–199)
HDL: 42 mg/dL (ref >39)
LDL Chol Calc (NIH): 89 mg/dL (ref 0–99)
Triglycerides: 108 mg/dL (ref 0–149)
VLDL Cholesterol Cal: 20 mg/dL (ref 5–40)

## 2020-06-13 MED ORDER — ATORVASTATIN CALCIUM 10 MG PO TABS: 10.0000 mg | ORAL_TABLET | Freq: Every day | ORAL | 3 refills | Status: DC

## 2020-06-13 MED ORDER — ATORVASTATIN CALCIUM 10 MG PO TABS: 10.0000 mg | ORAL_TABLET | Freq: Every day | ORAL | 1 refills | Status: DC

## 2020-06-13 NOTE — Telephone Encounter (Signed)
Pt returned call for results.

## 2020-06-13 NOTE — Addendum Note (Signed)
Addended by: Orland Mustard on: 06/13/2020 03:12 PM   Modules accepted: Orders

## 2020-06-14 ENCOUNTER — Telehealth: Payer: Self-pay | Admitting: Internal Medicine

## 2020-06-14 NOTE — Telephone Encounter (Signed)
Called and spoke with Delsa Sale at [facility] office. Still have not received Patient's records.  She states that they use a 3rd party for all past Patient record release. The company is called [employer]. States our request was forwarded to them.   Called and [employer] and spoke with Dawn. She states that there search range for requests goes back 3 months. Informed her that our request to the original office was 05/18/20. She states they never received the request from Dr [facility] office. Informed me to call them and have them re-send it.   Spoke with Melissa from Dr [facility] office. She looked in the system and states they never received the release faxed from our office. Confirmed that we have the correct fax number, it was, and informed her we will re-fax this with attention Melissa.   ROI sent back to fax.

## 2020-06-14 NOTE — Telephone Encounter (Signed)
Noted  

## 2020-06-14 NOTE — Telephone Encounter (Signed)
Confirmation of fax 5/21 at 9:54 am

## 2020-06-14 NOTE — Telephone Encounter (Signed)
I spoke with pt he states the pain is not steady its off and on and would like to monitor for now before starting PT.

## 2020-06-15 NOTE — Telephone Encounter (Signed)
Received fax that they do not have this on file.

## 2020-06-29 ENCOUNTER — Telehealth: Payer: Self-pay | Admitting: Internal Medicine

## 2020-06-29 NOTE — Telephone Encounter (Signed)
Hidden? Date Time Department/Location Provider Visit Type Status          No Tue 05/15/20 08:20 AM [facility], BRIAN NEW PATIENT Canceled  No Tue 04/24/20 08:20 AM [facility], BRIAN NEW PATIENT Canceled  No Fri 04/06/20 08:00 AM [facility], [facility]RIAN NEW PATIENT Canceled  No Thu 03/29/20 08:20 AM [facility], BRIAN NEW PATIENT Canceled

## 2020-07-09 ENCOUNTER — Other Ambulatory Visit: Payer: Self-pay | Admitting: Internal Medicine

## 2020-07-09 DIAGNOSIS — F419 Anxiety disorder, unspecified: Secondary | ICD-10-CM

## 2020-07-09 MED ORDER — HYDROXYZINE HCL 25 MG PO TABS: 25.0000 mg | ORAL_TABLET | Freq: Every evening | ORAL | 0 refills | Status: DC | PRN

## 2020-07-17 ENCOUNTER — Encounter: Payer: Self-pay | Admitting: Internal Medicine

## 2020-07-22 NOTE — Addendum Note (Signed)
Addended by: Orland Mustard on: 07/22/2020 11:37 PM   Modules accepted: Orders

## 2020-08-13 ENCOUNTER — Telehealth: Payer: Self-pay | Admitting: Internal Medicine

## 2020-08-13 NOTE — Telephone Encounter (Signed)
Patient was calling to check on the status his referral for a colonoscopy.

## 2020-08-13 NOTE — Telephone Encounter (Signed)
Patient informed of referral to Dr Alice Reichert being made 7/25 and records being faxed over 07/23/20. Patient given number to call below and be scheduled.   Office: [phone]

## 2020-09-06 ENCOUNTER — Other Ambulatory Visit: Payer: Self-pay | Admitting: Internal Medicine

## 2020-09-06 DIAGNOSIS — F419 Anxiety disorder, unspecified: Secondary | ICD-10-CM

## 2020-09-06 MED ORDER — HYDROXYZINE HCL 25 MG PO TABS
25.0000 mg | ORAL_TABLET | Freq: Every evening | ORAL | 0 refills | Status: DC | PRN
Start: 2020-09-06 — End: 2020-10-29

## 2020-09-29 IMAGING — MR MRI LUMBAR SPINE WITHOUT CONTRAST
4 of 5 series · 26 of 48 positions shown · non-contrast
Comparison: Lumbar spine x-rays dated February 24, 2019.

CLINICAL DATA: Chronic low back pain radiating into the right hip
for the past year.

EXAM:
MRI LUMBAR SPINE WITHOUT CONTRAST
TECHNIQUE: Multiplanar, multisequence MR imaging of the lumbar spine was
performed. No intravenous contrast was administered.

[Series 2: T2 · sagittal · 4.0mm · 0.81mm/px · 6 of 15 slices shown (1 of 2)]
[im 1/15]
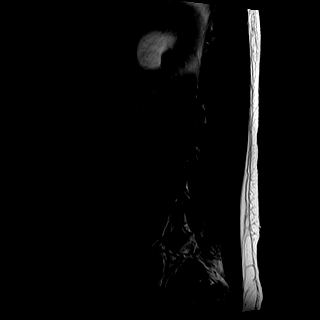
[im 3/15]
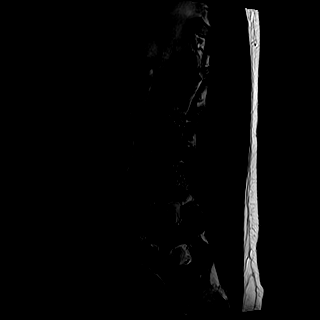
[im 6/15]
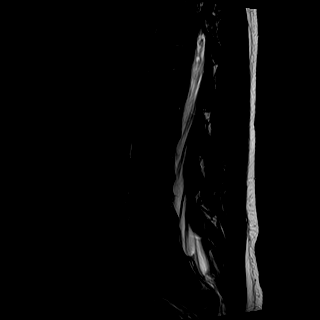
[im 9/15]
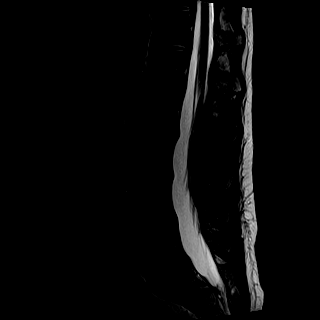
[im 12/15]
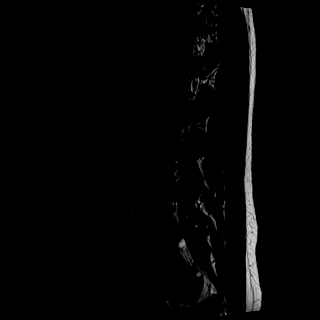
[im 15/15]
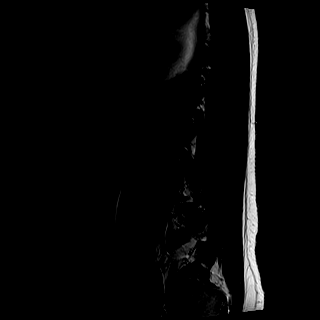

[Series 3: T1 · sagittal · 4.0mm · 0.41mm/px · 6 of 15 slices shown (1 of 2)]
[im 1/15]
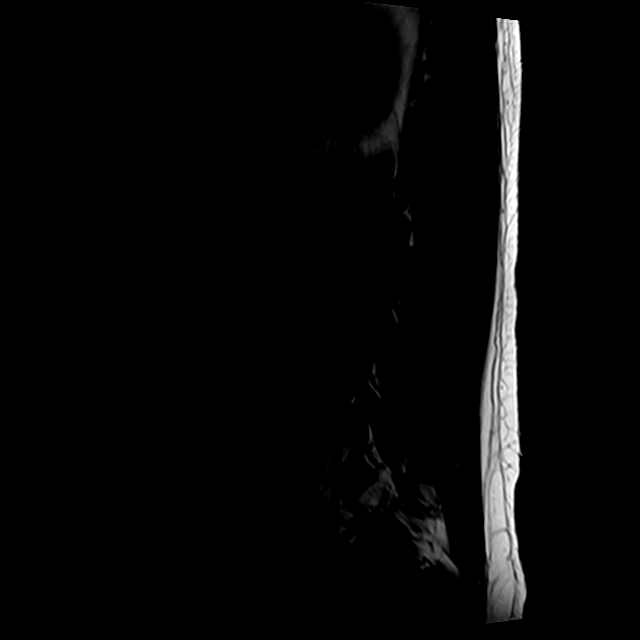
[im 3/15]
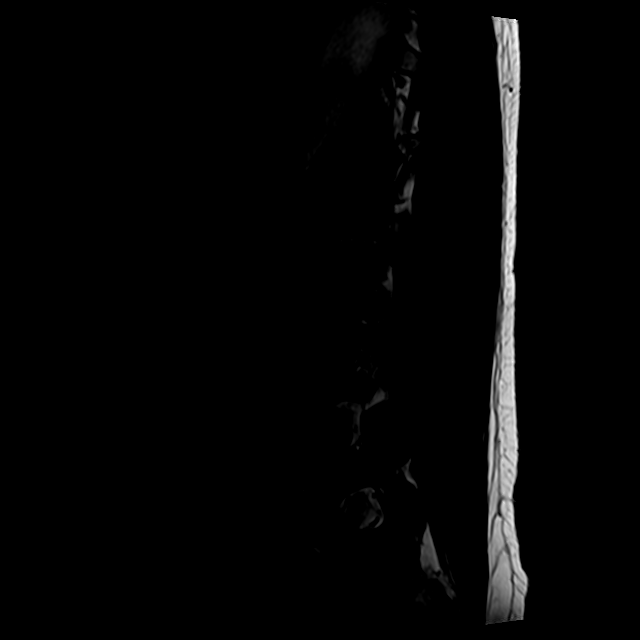
[im 6/15]
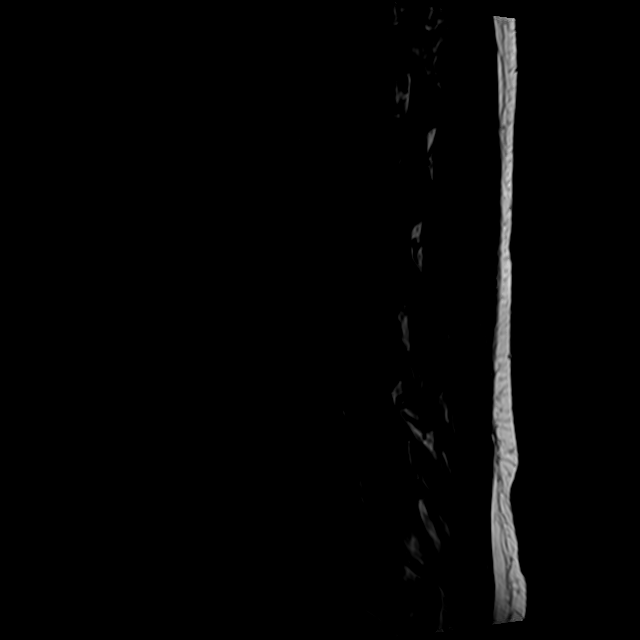
[im 9/15]
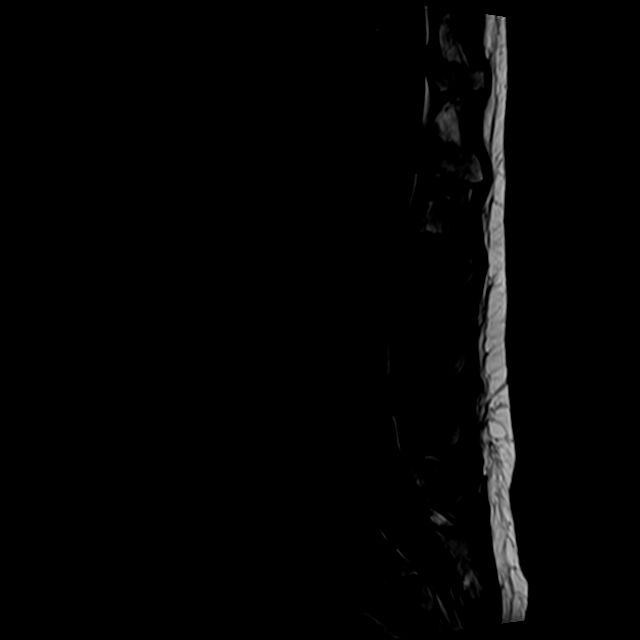
[im 12/15]
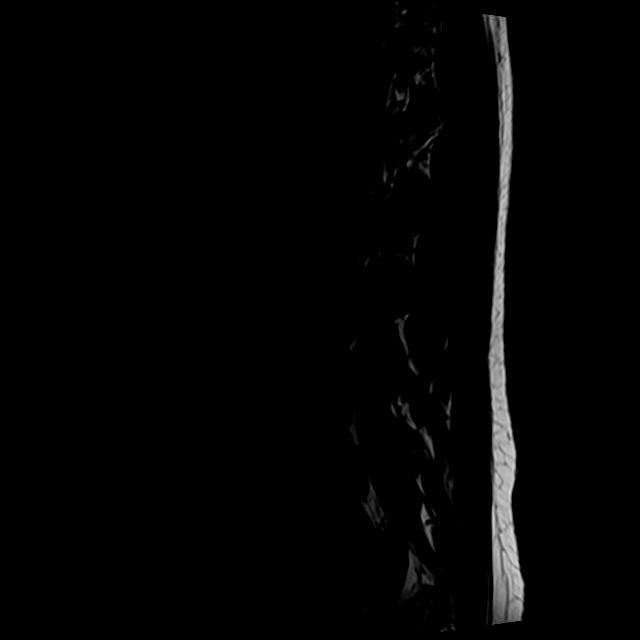
[im 15/15]
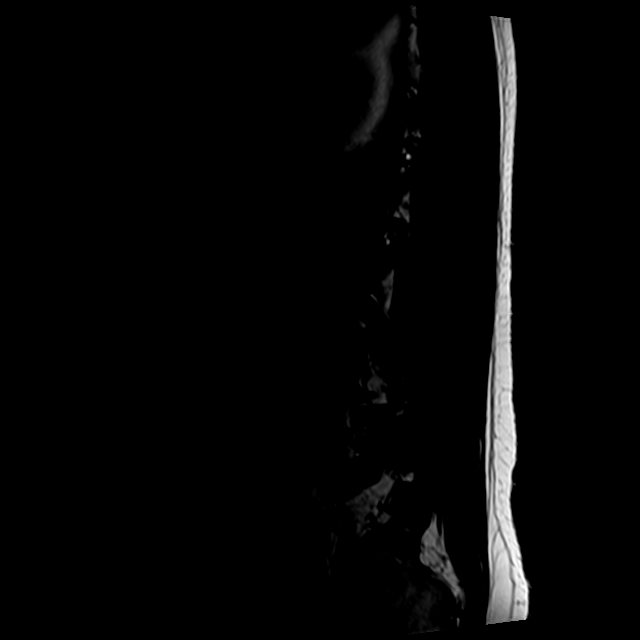

[Series 5: T2 · axial · 4.0mm · 0.78mm/px · z∈[-132,+98]mm · 9 of 41 slices shown (2 of 2)]
[im 1/41]
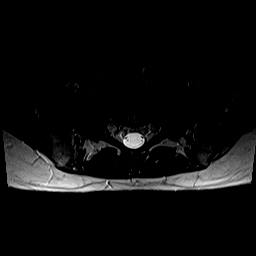
[im 6/41]
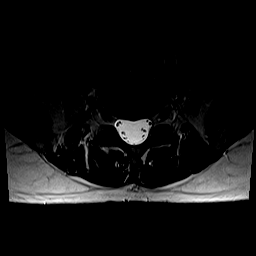
[im 12/41]
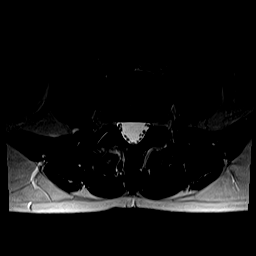
[im 18/41]
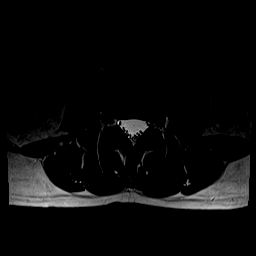
[im 21/41]
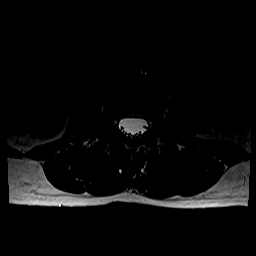
[im 23/41]
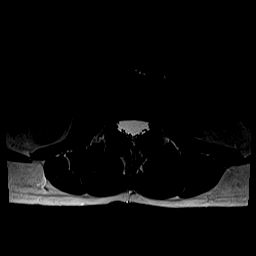
[im 29/41]
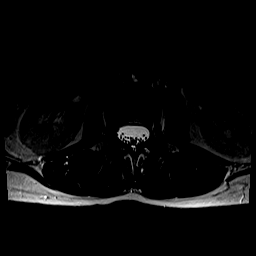
[im 35/41]
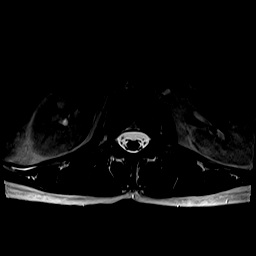
[im 41/41]
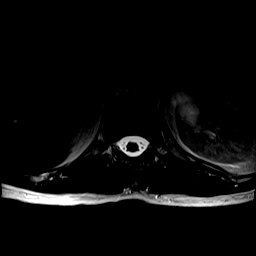

[Series 6: T1 · axial · 4.0mm · 0.39mm/px · z∈[-132,+68]mm · 5 of 41 slices shown (2 of 2)]
[im 1/41]
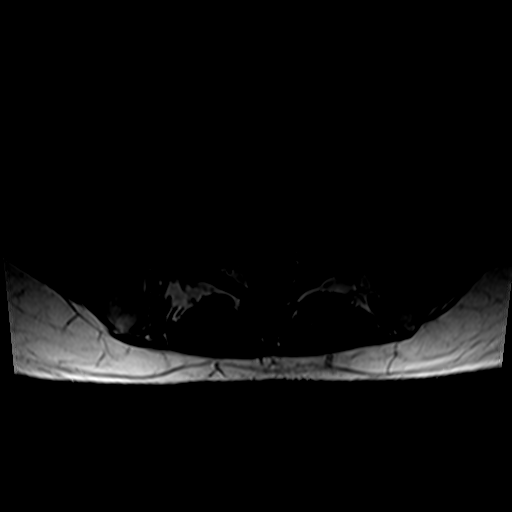
[im 6/41]
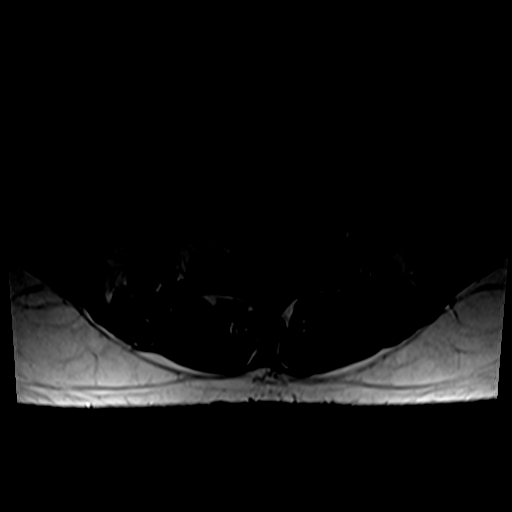
[im 12/41]
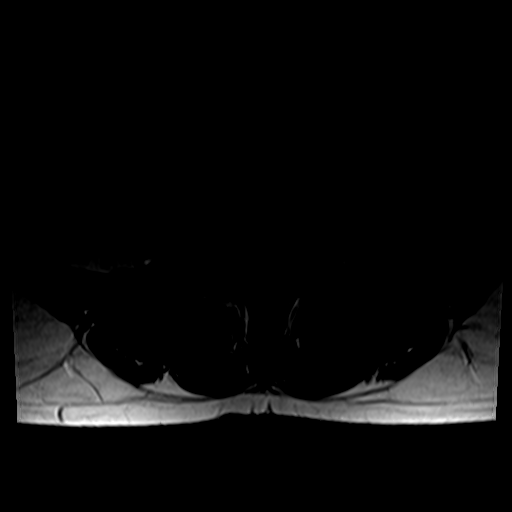
[im 21/41]
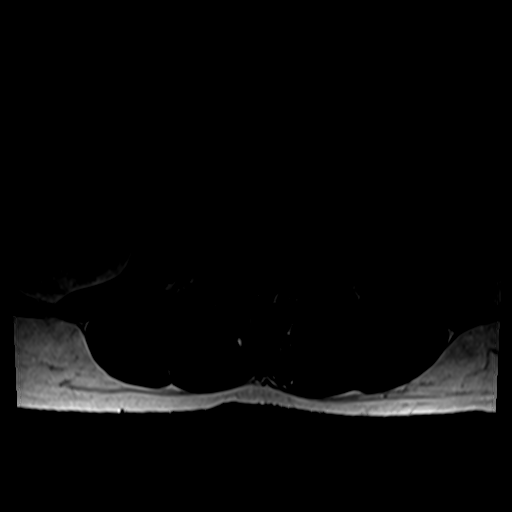
[im 35/41]
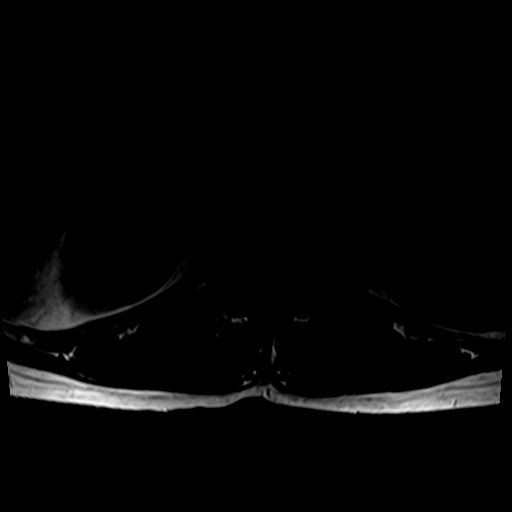

[26 of 48 positions shown; findings below may reference images not displayed]

FINDINGS: Segmentation:  Standard.

Alignment:  Unchanged trace retrolisthesis at L2-L3.

Vertebrae:  No fracture, evidence of discitis, or bone lesion.

Conus medullaris and cauda equina: Conus extends to the L2 level.
Conus and cauda equina appear normal.

Paraspinal and other soft tissues: 4.1 cm left renal simple cyst. 8
mm simple cyst in the posterior right hepatic lobe.

Disc levels:

T12-L1:  Negative.

L1-L2:  Negative.

L2-L3:  Mild disc height loss and bulging.  No stenosis.

L3-L4:  Mild disc height loss with minimal bulging.  No stenosis.

L4-L5: Negative disc. Moderate right facet arthropathy with mild
perifacet reactive marrow edema. No stenosis.

L5-S1:  Negative.
IMPRESSION: 1. Mild degenerative disc disease at L2-L3 and L3-L4. No stenosis or
impingement.
2. Moderate right facet arthropathy at L4-L5 with reactive perifacet
marrow edema, which can be a source of back pain.

## 2020-10-19 ENCOUNTER — Other Ambulatory Visit: Payer: Self-pay | Admitting: Internal Medicine

## 2020-10-19 ENCOUNTER — Encounter: Payer: Self-pay | Admitting: Internal Medicine

## 2020-10-19 DIAGNOSIS — I10 Essential (primary) hypertension: Secondary | ICD-10-CM

## 2020-10-19 MED ORDER — AMLODIPINE BESYLATE 2.5 MG PO TABS: 2.5000 mg | ORAL_TABLET | Freq: Two times a day (BID) | ORAL | 3 refills | Status: DC

## 2020-10-29 ENCOUNTER — Other Ambulatory Visit: Payer: Self-pay | Admitting: Internal Medicine

## 2020-10-29 DIAGNOSIS — F32A Depression, unspecified: Secondary | ICD-10-CM

## 2020-10-30 ENCOUNTER — Encounter: Payer: Self-pay | Admitting: Internal Medicine

## 2020-10-30 DIAGNOSIS — K219 Gastro-esophageal reflux disease without esophagitis: Secondary | ICD-10-CM

## 2020-10-30 MED ORDER — PANTOPRAZOLE SODIUM 20 MG PO TBEC: 20.0000 mg | DELAYED_RELEASE_TABLET | Freq: Every day | ORAL | 1 refills | Status: DC

## 2020-10-30 MED ORDER — HYDROXYZINE HCL 25 MG PO TABS: 25.0000 mg | ORAL_TABLET | Freq: Every evening | ORAL | 0 refills | Status: DC | PRN

## 2020-11-27 ENCOUNTER — Ambulatory Visit: Payer: Managed Care, Other (non HMO) | Admitting: Internal Medicine

## 2020-12-05 ENCOUNTER — Encounter: Payer: Self-pay | Admitting: Internal Medicine

## 2020-12-10 NOTE — Addendum Note (Signed)
Addended by: Orland Mustard on: 12/10/2020 08:19 AM   Modules accepted: Orders

## 2020-12-11 ENCOUNTER — Ambulatory Visit (INDEPENDENT_AMBULATORY_CARE_PROVIDER_SITE_OTHER): Payer: Managed Care, Other (non HMO) | Admitting: Cardiology

## 2020-12-11 ENCOUNTER — Telehealth: Payer: Self-pay | Admitting: Cardiology

## 2020-12-11 ENCOUNTER — Other Ambulatory Visit: Payer: Self-pay

## 2020-12-11 ENCOUNTER — Encounter: Payer: Self-pay | Admitting: Cardiology

## 2020-12-11 VITALS — BP 120/92 | HR 62 | Ht 66.0 in | Wt 163.0 lb

## 2020-12-11 DIAGNOSIS — I10 Essential (primary) hypertension: Secondary | ICD-10-CM

## 2020-12-11 NOTE — Telephone Encounter (Signed)
Attempted to schedule 2 m fu no ans no vm

## 2020-12-11 NOTE — Patient Instructions (Signed)
Medication Instructions:  Your physician recommends that you continue on your current medications as directed. Please refer to the Current Medication list given to you today.  *If you need a refill on your cardiac medications before your next appointment, please call your pharmacy*   Lab Work: None Ordered If you have labs (blood work) drawn today and your tests are completely normal, you will receive your results only by:  [place] (if you have MyChart) OR  A paper copy in the mail If you have any lab test that is abnormal or we need to change your treatment, we will call you to review the results.   Testing/Procedures: None Ordered   Follow-Up: At [facility], you and your health needs are our priority.  As part of our continuing mission to provide you with exceptional heart care, we have created designated Provider Care Teams.  These Care Teams include your primary Cardiologist (physician) and Advanced Practice Providers (APPs -  Physician Assistants and Nurse Practitioners) who all work together to provide you with the care you need, when you need it.  We recommend signing up for the patient portal called "MyChart".  Sign up information is provided on this After Visit Summary.  MyChart is used to connect with patients for Virtual Visits (Telemedicine).  Patients are able to view lab/test results, encounter notes, upcoming appointments, etc.  Non-urgent messages can be sent to your provider as well.   To learn more about what you can do with MyChart, go to [email or web address].    Your next appointment:   2 month(s)  The format for your next appointment:   In Person  Provider:   Kate Sable, MD   Other Instructions

## 2020-12-11 NOTE — Progress Notes (Signed)
Cardiology Office Note:    Date:  12/11/2020   ID:  Anthony Patton., DOB June 11, 1971, MRN [id number]  PCP:  McLean-Scocuzza, Nino Glow, MD  [facility] Cardiologist:  Kate Sable, MD  [place] Electrophysiologist:  None   Referring MD: McLean-Scocuzza, Olivia Mackie *   Chief Complaint  Patient presents with  . New Patient (Initial Visit)    Referred by PCP for HTN. Meds reviewed verbally with patient.    Anthony Patton. is a 49 y.o. male who is being seen today for the evaluation of hypertension at the request of McLean-Scocuzza, Olivia Mackie *.  History of Present Illness:    Anthony Patton. is a 49 y.o. male with a hx of hypertension who presents due to elevated blood pressures.  Patient states having fluctuating blood pressures typically higher in the morning and controlled towards end of the day.  Was previously placed on HCTZ but stopped due to being dehydrated.  Denies chest pain or shortness of breath.  Exercises frequently with riding his bike.  States checking his blood pressure frequently at home, sometimes he checks his blood pressure when stressed/depressed.  Compliant with all his medications.  Currently takes amlodipine 5 mg daily, losartan 100 mg daily.  Past Medical History:  Diagnosis Date  . Allergy   . Chicken pox   . Colon polyps   . Gastritis    with esophagitis   . GERD (gastroesophageal reflux disease)   . Hyperlipidemia   . Hypertension   . Low back pain     Past Surgical History:  Procedure Laterality Date  . APPENDECTOMY    . COLONOSCOPY W/ POLYPECTOMY    . KNEE SURGERY     arthroscopy x 2 torn cartilage   . LASIK     b/l  . TONSILLECTOMY      Current Medications: Current Meds  Medication Sig  . amLODipine (NORVASC) 2.5 MG tablet Take 1 tablet (2.5 mg total) by mouth in the morning and at bedtime. For BP >130/>80 take an additional 2.5 mg dose  . Ascorbic Acid (VITAMIN C PO) Take by mouth.  Marland Kitchen atorvastatin (LIPITOR) 10 MG  tablet Take 1 tablet (10 mg total) by mouth daily at 6 PM.  . cyclobenzaprine (FLEXERIL) 5 MG tablet Take 1 tablet (5 mg total) by mouth at bedtime as needed for muscle spasms.  . fexofenadine (ALLEGRA) 180 MG tablet Take 180 mg by mouth daily as needed for allergies or rhinitis.  . hydrOXYzine (ATARAX/VISTARIL) 25 MG tablet Take 1-2 tablets (25-50 mg total) by mouth at bedtime as needed. Or BID prn  . losartan (COZAAR) 100 MG tablet Take 1 tablet (100 mg total) by mouth daily.  Marland Kitchen LYSINE PO Take by mouth.  Marland Kitchen MAGNESIUM PO Take by mouth.  . Multiple Vitamin (MULTIVITAMIN) capsule Take 1 capsule by mouth daily.  . Multiple Vitamins-Minerals (ZINC PO) Take by mouth.  . pantoprazole (PROTONIX) 20 MG tablet Take 1 tablet (20 mg total) by mouth daily. 30 minutes before food  . sildenafil (REVATIO) 20 MG tablet Take 1-5 tablets (20-100 mg total) by mouth daily as needed.     Allergies:   Hctz [hydrochlorothiazide] and Penicillins   Social History   Socioeconomic History  . Marital status: Married    Spouse name: Not on file  . Number of children: Not on file  . Years of education: Not on file  . Highest education level: Not on file  Occupational History  . Not on file  Tobacco Use  .  Smoking status: Never Smoker  . Smokeless tobacco: Never Used  Substance and Sexual Activity  . Alcohol use: Yes  . Drug use: Not Currently  . Sexual activity: Yes  Other Topics Concern  . Not on file  Social History Narrative   Married    Moved from [place] in [employer] degree   Kids x2   No guns   Wears seat belt    Safe in relationship    Social Determinants of Health   Financial Resource Strain: Not on file  Food Insecurity: Not on file  Transportation Needs: Not on file  Physical Activity: Not on file  Stress: Not on file  Social Connections: Not on file     Family History: The patient's family history includes Cancer in his paternal grandfather; Colon polyps in his father;  Diabetes in his father, maternal grandmother, mother, and paternal grandfather; Heart attack in his mother and paternal grandfather; Heart disease in his maternal grandfather; Hyperlipidemia in his father and paternal grandmother; Hypertension in his father and mother; Valvular heart disease in his paternal grandmother.  ROS:   Please see the history of present illness.     All other systems reviewed and are negative.  EKGs/Labs/Other Studies Reviewed:    The following studies were reviewed today:   EKG:  EKG is  ordered today.  The ekg ordered today demonstrates normal sinus rhythm  Recent Labs: 03/12/2020: Hemoglobin 15.9; Platelets 210; TSH 1.230 06/12/2020: ALT 34; BUN 16; Creatinine, Ser 1.15; Potassium 4.9; Sodium 142  Recent Lipid Panel    Component Value Date/Time   CHOL 151 06/12/2020 0724   TRIG 108 06/12/2020 0724   HDL 42 06/12/2020 0724   CHOLHDL 3.6 06/12/2020 0724   CHOLHDL 6 02/08/2019 0801   VLDL 39.4 02/08/2019 0801   LDLCALC 89 06/12/2020 0724     Risk Assessment/Calculations:      Physical Exam:    VS:  BP (!) 120/92 (BP Location: Right Arm, Patient Position: Sitting, Cuff Size: Normal)   Pulse 62   Ht 5\' 6"  (1.676 m)   Wt 163 lb (73.9 kg)   BMI 26.31 kg/m     Wt Readings from Last 3 Encounters:  12/11/20 163 lb (73.9 kg)  05/17/20 158 lb 3.2 oz (71.8 kg)  03/16/20 158 lb 3.2 oz (71.8 kg)     GEN:  Well nourished, well developed in no acute distress HEENT: Normal NECK: No JVD; No carotid bruits LYMPHATICS: No lymphadenopathy CARDIAC: RRR, no murmurs, rubs, gallops RESPIRATORY:  Clear to auscultation without rales, wheezing or rhonchi  ABDOMEN: Soft, non-tender, non-distended MUSCULOSKELETAL:  No edema; No deformity  SKIN: Warm and dry NEUROLOGIC:  Alert and oriented x 3 PSYCHIATRIC:  Normal affect   ASSESSMENT:    1. Primary hypertension    PLAN:    In order of problems listed above:  1. Patient with history of hypertension.  BP  appears controlled today in the office.  Upon further questioning, patient sometimes checks his blood pressure in his stress/anxious state, which could lead to falsely elevated readings.  He was advised to check blood pressures when relaxed, calm.  If BPs stay elevated, will consider titrating his current medications.  Continue losartan and amlodipine as prescribed for now.  Follow-up 2 months   Medication Adjustments/Labs and Tests Ordered: Current medicines are reviewed at length with the patient today.  Concerns regarding medicines are outlined above.  Orders Placed This Encounter  Procedures  . EKG 12-Lead  No orders of the defined types were placed in this encounter.   Patient Instructions  Medication Instructions:  Your physician recommends that you continue on your current medications as directed. Please refer to the Current Medication list given to you today.  *If you need a refill on your cardiac medications before your next appointment, please call your pharmacy*   Lab Work: None Ordered If you have labs (blood work) drawn today and your tests are completely normal, you will receive your results only by: Marland Kitchen MyChart Message (if you have MyChart) OR . A paper copy in the mail If you have any lab test that is abnormal or we need to change your treatment, we will call you to review the results.   Testing/Procedures: None Ordered   Follow-Up: At [facility], you and your health needs are our priority.  As part of our continuing mission to provide you with exceptional heart care, we have created designated Provider Care Teams.  These Care Teams include your primary Cardiologist (physician) and Advanced Practice Providers (APPs -  Physician Assistants and Nurse Practitioners) who all work together to provide you with the care you need, when you need it.  We recommend signing up for the patient portal called "MyChart".  Sign up information is provided on this After Visit  Summary.  MyChart is used to connect with patients for Virtual Visits (Telemedicine).  Patients are able to view lab/test results, encounter notes, upcoming appointments, etc.  Non-urgent messages can be sent to your provider as well.   To learn more about what you can do with MyChart, go to [email or web address].    Your next appointment:   2 month(s)  The format for your next appointment:   In Person  Provider:   Kate Sable, MD   Other Instructions      Signed, Kate Sable, MD  12/11/2020 10:34 AM    [place]

## 2020-12-13 ENCOUNTER — Other Ambulatory Visit: Payer: Self-pay | Admitting: Internal Medicine

## 2020-12-13 DIAGNOSIS — F419 Anxiety disorder, unspecified: Secondary | ICD-10-CM

## 2020-12-13 LAB — HM COLONOSCOPY

## 2020-12-13 NOTE — Telephone Encounter (Signed)
Unable to reach.  Placed recall and closing encounter.

## 2020-12-14 MED ORDER — HYDROXYZINE HCL 25 MG PO TABS: 25.0000 mg | ORAL_TABLET | Freq: Every evening | ORAL | 0 refills | Status: DC | PRN

## 2020-12-17 ENCOUNTER — Telehealth: Payer: Self-pay | Admitting: Internal Medicine

## 2020-12-17 DIAGNOSIS — E785 Hyperlipidemia, unspecified: Secondary | ICD-10-CM

## 2020-12-17 MED ORDER — ATORVASTATIN CALCIUM 10 MG PO TABS: 10.0000 mg | ORAL_TABLET | Freq: Every day | ORAL | 3 refills | Status: DC

## 2020-12-17 NOTE — Addendum Note (Signed)
Addended by: Elpidio Galea T on: 12/17/2020 12:50 PM   Modules accepted: Orders

## 2020-12-17 NOTE — Telephone Encounter (Signed)
Pt needs a refill on atorvastatin (LIPITOR) 10 MG tablet sent to [facility]

## 2021-01-04 ENCOUNTER — Telehealth: Payer: Self-pay | Admitting: Internal Medicine

## 2021-01-04 NOTE — Telephone Encounter (Signed)
Patient called in wanted labs done before his physical on 03-19-21

## 2021-01-07 NOTE — Telephone Encounter (Signed)
Please advise  Patient needing lab orders

## 2021-01-07 NOTE — Telephone Encounter (Signed)
Labs on printer [facility] not due until 03/12/21 [facility]  Thanks he can come pick up

## 2021-01-07 NOTE — Addendum Note (Signed)
Addended by: Thressa Sheller on: 01/07/2021 11:06 AM   Modules accepted: Orders

## 2021-01-07 NOTE — Addendum Note (Signed)
Addended by: Orland Mustard on: 01/07/2021 09:40 AM   Modules accepted: Orders

## 2021-01-07 NOTE — Telephone Encounter (Signed)
Called and informed Patient. Patient states that he would rather come in to our lab. Re-ordered labs for future to be drawn here. Scheduled lab appointment for 03/12/21 at 8:15 fasting labs

## 2021-01-09 ENCOUNTER — Ambulatory Visit: Payer: Managed Care, Other (non HMO) | Admitting: Internal Medicine

## 2021-02-06 ENCOUNTER — Telehealth: Payer: Self-pay | Admitting: Cardiology

## 2021-02-06 NOTE — Telephone Encounter (Signed)
Patient does not wish to follow up  Deleting recall

## 2021-02-27 ENCOUNTER — Other Ambulatory Visit: Payer: Self-pay | Admitting: Internal Medicine

## 2021-02-27 DIAGNOSIS — F32A Depression, unspecified: Secondary | ICD-10-CM

## 2021-02-27 DIAGNOSIS — F419 Anxiety disorder, unspecified: Secondary | ICD-10-CM

## 2021-02-27 MED ORDER — HYDROXYZINE HCL 25 MG PO TABS: 25.0000 mg | ORAL_TABLET | Freq: Every evening | ORAL | 3 refills | Status: DC | PRN

## 2021-03-12 ENCOUNTER — Other Ambulatory Visit (INDEPENDENT_AMBULATORY_CARE_PROVIDER_SITE_OTHER): Payer: BC Managed Care – PPO

## 2021-03-12 ENCOUNTER — Other Ambulatory Visit: Payer: Self-pay

## 2021-03-12 DIAGNOSIS — Z13818 Encounter for screening for other digestive system disorders: Secondary | ICD-10-CM | POA: Diagnosis not present

## 2021-03-12 DIAGNOSIS — I10 Essential (primary) hypertension: Secondary | ICD-10-CM

## 2021-03-12 DIAGNOSIS — Z125 Encounter for screening for malignant neoplasm of prostate: Secondary | ICD-10-CM | POA: Diagnosis not present

## 2021-03-12 DIAGNOSIS — Z0000 Encounter for general adult medical examination without abnormal findings: Secondary | ICD-10-CM | POA: Diagnosis not present

## 2021-03-12 DIAGNOSIS — Z1389 Encounter for screening for other disorder: Secondary | ICD-10-CM

## 2021-03-13 LAB — COMPREHENSIVE METABOLIC PANEL
ALT: 34 IU/L (ref 0–44)
AST: 29 IU/L (ref 0–40)
Albumin/Globulin Ratio: 2.6 — ABNORMAL HIGH (ref 1.2–2.2)
Albumin: 5 g/dL — ABNORMAL HIGH (ref 3.8–4.9)
Alkaline Phosphatase: 103 IU/L (ref 44–121)
BUN/Creatinine Ratio: 11 (ref 9–20)
BUN: 12 mg/dL (ref 6–24)
Bilirubin Total: 0.8 mg/dL (ref 0.0–1.2)
CO2: 23 mmol/L (ref 20–29)
Calcium: 9.6 mg/dL (ref 8.7–10.2)
Chloride: 102 mmol/L (ref 96–106)
Creatinine, Ser: 1.11 mg/dL (ref 0.76–1.27)
Globulin, Total: 1.9 g/dL (ref 1.5–4.5)
Glucose: 94 mg/dL (ref 65–99)
Potassium: 4.7 mmol/L (ref 3.5–5.2)
Sodium: 145 mmol/L — ABNORMAL HIGH (ref 134–144)
Total Protein: 6.9 g/dL (ref 6.0–8.5)
eGFR: 80 mL/min/{1.73_m2} (ref >59)

## 2021-03-13 LAB — CBC WITH DIFFERENTIAL/PLATELET
Basophils Absolute: 0.1 10*3/uL (ref 0.0–0.2)
Basos: 2 %
EOS (ABSOLUTE): 0.3 10*3/uL (ref 0.0–0.4)
Eos: 5 %
Hematocrit: 47.1 % (ref 37.5–51.0)
Hemoglobin: 16.1 g/dL (ref 13.0–17.7)
Immature Grans (Abs): 0 10*3/uL (ref 0.0–0.1)
Immature Granulocytes: 0 %
Lymphocytes Absolute: 1.3 10*3/uL (ref 0.7–3.1)
Lymphs: 23 %
MCH: 29.5 pg (ref 26.6–33.0)
MCHC: 34.2 g/dL (ref 31.5–35.7)
MCV: 86 fL (ref 79–97)
Monocytes Absolute: 0.4 10*3/uL (ref 0.1–0.9)
Monocytes: 7 %
Neutrophils Absolute: 3.8 10*3/uL (ref 1.4–7.0)
Neutrophils: 63 %
Platelets: 205 10*3/uL (ref 150–450)
RBC: 5.46 x10E6/uL (ref 4.14–5.80)
RDW: 13.3 % (ref 11.6–15.4)
WBC: 5.9 10*3/uL (ref 3.4–10.8)

## 2021-03-13 LAB — URINALYSIS, ROUTINE W REFLEX MICROSCOPIC
Bilirubin, UA: NEGATIVE
Glucose, UA: NEGATIVE
Leukocytes,UA: NEGATIVE
Nitrite, UA: NEGATIVE
Protein,UA: NEGATIVE
RBC, UA: NEGATIVE
Specific Gravity, UA: 1.022 (ref 1.005–1.030)
Urobilinogen, Ur: 1 mg/dL (ref 0.2–1.0)
pH, UA: 6.5 (ref 5.0–7.5)

## 2021-03-13 LAB — LIPID PANEL
Chol/HDL Ratio: 3.3 ratio (ref 0.0–5.0)
Cholesterol, Total: 153 mg/dL (ref 100–199)
HDL: 47 mg/dL (ref >39)
LDL Chol Calc (NIH): 86 mg/dL (ref 0–99)
Triglycerides: 111 mg/dL (ref 0–149)
VLDL Cholesterol Cal: 20 mg/dL (ref 5–40)

## 2021-03-13 LAB — TSH: TSH: 1.27 u[IU]/mL (ref 0.450–4.500)

## 2021-03-13 LAB — HEPATITIS C ANTIBODY: Hep C Virus Ab: <0.1 s/co ratio (ref 0.0–0.9)

## 2021-03-13 LAB — PSA: Prostate Specific Ag, Serum: 0.9 ng/mL (ref 0.0–4.0)

## 2021-03-19 ENCOUNTER — Encounter: Payer: Self-pay | Admitting: Internal Medicine

## 2021-03-19 ENCOUNTER — Ambulatory Visit (INDEPENDENT_AMBULATORY_CARE_PROVIDER_SITE_OTHER): Payer: BC Managed Care – PPO | Admitting: Internal Medicine

## 2021-03-19 ENCOUNTER — Other Ambulatory Visit: Payer: Self-pay

## 2021-03-19 VITALS — BP 122/84 | HR 69 | Temp 97.6°F | Ht 67.32 in | Wt 165.2 lb

## 2021-03-19 DIAGNOSIS — N529 Male erectile dysfunction, unspecified: Secondary | ICD-10-CM

## 2021-03-19 DIAGNOSIS — E870 Hyperosmolality and hypernatremia: Secondary | ICD-10-CM | POA: Diagnosis not present

## 2021-03-19 DIAGNOSIS — Z0000 Encounter for general adult medical examination without abnormal findings: Secondary | ICD-10-CM | POA: Diagnosis not present

## 2021-03-19 DIAGNOSIS — K219 Gastro-esophageal reflux disease without esophagitis: Secondary | ICD-10-CM

## 2021-03-19 DIAGNOSIS — I959 Hypotension, unspecified: Secondary | ICD-10-CM

## 2021-03-19 DIAGNOSIS — I10 Essential (primary) hypertension: Secondary | ICD-10-CM | POA: Diagnosis not present

## 2021-03-19 DIAGNOSIS — E785 Hyperlipidemia, unspecified: Secondary | ICD-10-CM

## 2021-03-19 DIAGNOSIS — R0989 Other specified symptoms and signs involving the circulatory and respiratory systems: Secondary | ICD-10-CM

## 2021-03-19 DIAGNOSIS — R779 Abnormality of plasma protein, unspecified: Secondary | ICD-10-CM

## 2021-03-19 DIAGNOSIS — M542 Cervicalgia: Secondary | ICD-10-CM

## 2021-03-19 LAB — COMPREHENSIVE METABOLIC PANEL
ALT: 31 U/L (ref 0–53)
AST: 32 U/L (ref 0–37)
Albumin: 4.6 g/dL (ref 3.5–5.2)
Alkaline Phosphatase: 86 U/L (ref 39–117)
BUN: 14 mg/dL (ref 6–23)
CO2: 28 mEq/L (ref 19–32)
Calcium: 9.4 mg/dL (ref 8.4–10.5)
Chloride: 104 mEq/L (ref 96–112)
Creatinine, Ser: 1.16 mg/dL (ref 0.40–1.50)
GFR: 72.62 mL/min (ref >60.00)
Glucose, Bld: 89 mg/dL (ref 70–99)
Potassium: 5.1 mEq/L (ref 3.5–5.1)
Sodium: 139 mEq/L (ref 135–145)
Total Bilirubin: 1 mg/dL (ref 0.2–1.2)
Total Protein: 6.6 g/dL (ref 6.0–8.3)

## 2021-03-19 MED ORDER — SILDENAFIL CITRATE 20 MG PO TABS
20.0000 mg | ORAL_TABLET | Freq: Every day | ORAL | 11 refills | Status: DC | PRN
Start: 2021-03-19 — End: 2022-08-25

## 2021-03-19 MED ORDER — PANTOPRAZOLE SODIUM 20 MG PO TBEC: 20.0000 mg | DELAYED_RELEASE_TABLET | Freq: Every day | ORAL | 3 refills | Status: DC

## 2021-03-19 MED ORDER — LOSARTAN POTASSIUM 100 MG PO TABS: 100.0000 mg | ORAL_TABLET | Freq: Every day | ORAL | 3 refills | Status: DC

## 2021-03-19 MED ORDER — CYCLOBENZAPRINE HCL 5 MG PO TABS
5.0000 mg | ORAL_TABLET | Freq: Every evening | ORAL | 2 refills | Status: DC | PRN
Start: 2021-03-19 — End: 2023-06-06

## 2021-03-19 NOTE — Progress Notes (Signed)
Chief Complaint  Patient presents with  . Annual Exam   Annual  1. HTN on norvasc 2.5 mg qd and losartan 100 mg qd when was taking norvasc 5 mg in am BP went to 80s/50s with exertion biking 12 miles  At times BP spikes wants 2nd opinion from cardiology  HLD on lipitor 10 mg qhs lipids improved   Review of Systems  Constitutional: Negative for weight loss.  HENT: Negative for hearing loss.   Eyes: Negative for blurred vision.  Respiratory: Negative for shortness of breath.   Cardiovascular: Negative for chest pain.  Gastrointestinal: Negative for abdominal pain.  Musculoskeletal: Negative for falls and joint pain.  Skin: Negative for rash.  Neurological: Negative for headaches.  Psychiatric/Behavioral: Negative for depression.   Past Medical History:  Diagnosis Date  . Allergy   . Chicken pox   . Colon polyps   . Gastritis    with esophagitis   . GERD (gastroesophageal reflux disease)   . Hyperlipidemia   . Hypertension   . Low back pain    Past Surgical History:  Procedure Laterality Date  . APPENDECTOMY    . COLONOSCOPY W/ POLYPECTOMY    . KNEE SURGERY     arthroscopy x 2 torn cartilage   . LASIK     b/l  . TONSILLECTOMY     Family History  Problem Relation Age of Onset  . Diabetes Mother   . Hypertension Mother   . Heart attack Mother        mid 66s  . Diabetes Father   . Hypertension Father   . Hyperlipidemia Father   . Colon polyps Father        precancerous   . Diabetes Maternal Grandmother   . Heart disease Maternal Grandfather        MI age 50s-50s   . Hyperlipidemia Paternal Grandmother   . Valvular heart disease Paternal Grandmother   . Diabetes Paternal Grandfather   . Cancer Paternal Grandfather        lung cancer smoker   . Heart attack Paternal Grandfather        in age 53s    Social History   Socioeconomic History  . Marital status: Married    Spouse name: Not on file  . Number of children: Not on file  . Years of education: Not on  file  . Highest education level: Not on file  Occupational History  . Not on file  Tobacco Use  . Smoking status: Never Smoker  . Smokeless tobacco: Never Used  Substance and Sexual Activity  . Alcohol use: Yes  . Drug use: Not Currently  . Sexual activity: Yes  Other Topics Concern  . Not on file  Social History Narrative   Married    Moved from [place] in [employer] degree   Kids x2   No guns   Wears seat belt    Safe in relationship    Social Determinants of Health   Financial Resource Strain: Not on file  Food Insecurity: Not on file  Transportation Needs: Not on file  Physical Activity: Not on file  Stress: Not on file  Social Connections: Not on file  Intimate Partner Violence: Not on file   Current Meds  Medication Sig  . amLODipine (NORVASC) 2.5 MG tablet Take 1 tablet (2.5 mg total) by mouth in the morning and at bedtime. For BP >130/>80 take an additional 2.5 mg dose  . Ascorbic Acid (VITAMIN C  PO) Take by mouth.  Marland Kitchen atorvastatin (LIPITOR) 10 MG tablet Take 1 tablet (10 mg total) by mouth daily at 6 PM.  . hydrOXYzine (ATARAX/VISTARIL) 25 MG tablet Take 1-2 tablets (25-50 mg total) by mouth at bedtime as needed. Or BID prn  . LYSINE PO Take by mouth.  . Multiple Vitamin (MULTIVITAMIN) capsule Take 1 capsule by mouth daily.  . [DISCONTINUED] cyclobenzaprine (FLEXERIL) 5 MG tablet Take 1 tablet (5 mg total) by mouth at bedtime as needed for muscle spasms.  . [DISCONTINUED] losartan (COZAAR) 100 MG tablet Take 1 tablet (100 mg total) by mouth daily.  . [DISCONTINUED] pantoprazole (PROTONIX) 20 MG tablet Take 1 tablet (20 mg total) by mouth daily. 30 minutes before food  . [DISCONTINUED] sildenafil (REVATIO) 20 MG tablet Take 1-5 tablets (20-100 mg total) by mouth daily as needed.   Allergies  Allergen Reactions  . Hctz [Hydrochlorothiazide]     Dizziness    . Penicillins Rash    Rash as chid but able to tolerate amoxicillin    Recent Results (from the  past 2160 hour(s))  CBC w/Diff     Status: None   Collection Time: 03/12/21  8:02 AM  Result Value Ref Range   WBC 5.9 3.4 - 10.8 x10E3/uL   RBC 5.46 4.14 - 5.80 x10E6/uL   Hemoglobin 16.1 13.0 - 17.7 g/dL   Hematocrit 47.1 37.5 - 51.0 %   MCV 86 79 - 97 fL   MCH 29.5 26.6 - 33.0 pg   MCHC 34.2 31.5 - 35.7 g/dL   RDW 13.3 11.6 - 15.4 %   Platelets 205 150 - 450 x10E3/uL   Neutrophils 63 Not Estab. %   Lymphs 23 Not Estab. %   Monocytes 7 Not Estab. %   Eos 5 Not Estab. %   Basos 2 Not Estab. %   Neutrophils Absolute 3.8 1.4 - 7.0 x10E3/uL   Lymphocytes Absolute 1.3 0.7 - 3.1 x10E3/uL   Monocytes Absolute 0.4 0.1 - 0.9 x10E3/uL   EOS (ABSOLUTE) 0.3 0.0 - 0.4 x10E3/uL   Basophils Absolute 0.1 0.0 - 0.2 x10E3/uL   Immature Granulocytes 0 Not Estab. %   Immature Grans (Abs) 0.0 0.0 - 0.1 x10E3/uL  Comprehensive metabolic panel     Status: Abnormal   Collection Time: 03/12/21  8:02 AM  Result Value Ref Range   Glucose 94 65 - 99 mg/dL   BUN 12 6 - 24 mg/dL   Creatinine, Ser 1.11 0.76 - 1.27 mg/dL   eGFR 80 >59 mL/min/1.73   BUN/Creatinine Ratio 11 9 - 20   Sodium 145 (H) 134 - 144 mmol/L   Potassium 4.7 3.5 - 5.2 mmol/L   Chloride 102 96 - 106 mmol/L   CO2 23 20 - 29 mmol/L   Calcium 9.6 8.7 - 10.2 mg/dL   Total Protein 6.9 6.0 - 8.5 g/dL   Albumin 5.0 (H) 3.8 - 4.9 g/dL   Globulin, Total 1.9 1.5 - 4.5 g/dL   Albumin/Globulin Ratio 2.6 (H) 1.2 - 2.2   Bilirubin Total 0.8 0.0 - 1.2 mg/dL   Alkaline Phosphatase 103 44 - 121 IU/L   AST 29 0 - 40 IU/L   ALT 34 0 - 44 IU/L  Lipid panel     Status: None   Collection Time: 03/12/21  8:02 AM  Result Value Ref Range   Cholesterol, Total 153 100 - 199 mg/dL   Triglycerides 111 0 - 149 mg/dL   HDL 47 >39 mg/dL   VLDL  Cholesterol Cal 20 5 - 40 mg/dL   LDL Chol Calc (NIH) 86 0 - 99 mg/dL   Chol/HDL Ratio 3.3 0.0 - 5.0 ratio    Comment:                                   T. Chol/HDL Ratio                                              Men  Women                               1/2 Avg.Risk  3.4    3.3                                   Avg.Risk  5.0    4.4                                2X Avg.Risk  9.6    7.1                                3X Avg.Risk 23.4   11.0   Hepatitis C antibody     Status: None   Collection Time: 03/12/21  8:02 AM  Result Value Ref Range   Hep C Virus Ab <0.1 0.0 - 0.9 s/co ratio    Comment:                                   Negative:     < 0.8                              Indeterminate: 0.8 - 0.9                                   Positive:     > 0.9  The CDC recommends that a positive HCV antibody result  be followed up with a HCV Nucleic Acid Amplification  test (010272).   PSA     Status: None   Collection Time: 03/12/21  8:02 AM  Result Value Ref Range   Prostate Specific Ag, Serum 0.9 0.0 - 4.0 ng/mL    Comment: Roche ECLIA methodology. According to the American Urological Association, Serum PSA should decrease and remain at undetectable levels after radical prostatectomy. The AUA defines biochemical recurrence as an initial PSA value 0.2 ng/mL or greater followed by a subsequent confirmatory PSA value 0.2 ng/mL or greater. Values obtained with different assay methods or kits cannot be used interchangeably. Results cannot be interpreted as absolute evidence of the presence or absence of malignant disease.   TSH     Status: None   Collection Time: 03/12/21  8:02 AM  Result Value Ref Range   TSH 1.270 0.450 - 4.500 uIU/mL  Urinalysis, Routine w reflex microscopic     Status:  Abnormal   Collection Time: 03/12/21  8:02 AM  Result Value Ref Range   Specific Gravity, UA 1.022 1.005 - 1.030   pH, UA 6.5 5.0 - 7.5   Color, UA Yellow Yellow   Appearance Ur Clear Clear   Leukocytes,UA Negative Negative   Protein,UA Negative Negative/Trace   Glucose, UA Negative Negative   Ketones, UA Trace (A) Negative   RBC, UA Negative Negative   Bilirubin, UA Negative Negative   Urobilinogen,  Ur 1.0 0.2 - 1.0 mg/dL   Nitrite, UA Negative Negative   Microscopic Examination Comment     Comment: Microscopic not indicated and not performed.   Objective  Body mass index is 25.63 kg/m. Wt Readings from Last 3 Encounters:  03/19/21 165 lb 3.2 oz (74.9 kg)  12/11/20 163 lb (73.9 kg)  05/17/20 158 lb 3.2 oz (71.8 kg)   Temp Readings from Last 3 Encounters:  03/19/21 97.6 F (36.4 C) (Oral)  05/17/20 97.9 F (36.6 C) (Temporal)  03/16/20 (!) 97.2 F (36.2 C) (Temporal)   BP Readings from Last 3 Encounters:  03/19/21 122/84  12/11/20 (!) 120/92  05/17/20 100/70   Pulse Readings from Last 3 Encounters:  03/19/21 69  12/11/20 62  05/17/20 80    Physical Exam Vitals and nursing note reviewed.  Constitutional:      Appearance: Normal appearance. He is well-developed and well-groomed.  HENT:     Head: Normocephalic and atraumatic.  Eyes:     Conjunctiva/sclera: Conjunctivae normal.     Pupils: Pupils are equal, round, and reactive to light.  Cardiovascular:     Rate and Rhythm: Normal rate and regular rhythm.     Heart sounds: Normal heart sounds. No murmur heard.   Pulmonary:     Effort: Pulmonary effort is normal.     Breath sounds: Normal breath sounds.  Abdominal:     Tenderness: There is no abdominal tenderness.  Skin:    General: Skin is warm and dry.  Neurological:     General: No focal deficit present.     Mental Status: He is alert and oriented to person, place, and time. Mental status is at baseline.     Gait: Gait normal.  Psychiatric:        Attention and Perception: Attention and perception normal.        Mood and Affect: Mood and affect normal.        Speech: Speech normal.        Behavior: Behavior normal. Behavior is cooperative.        Thought Content: Thought content normal.        Cognition and Memory: Cognition and memory normal.        Judgment: Judgment normal.     Assessment  Plan  Annual physical exam - Plan: Ambulatory  referral to Cardiology Flu shot utd Check vxs (I.e Tdap) old PCPcheck date Consider shingrix vaccine Needs hep B and MMR vaccine covid 19 vx had 2/2had 3rd dose 11/14/20  Check colonoscopy old PCP had x 2 dad h/o polypsprecancerous had age 58 and 56 , also had labs 07/2018 -no colonoscopy in recordsper pt had 2-3 colonoscopies in the past h/o polyp removed small -pt will try to contact GI in [place] to get colonoscopy records  -? When due for colonoscopy f/uPCP records cant find last report but possibly had last colonoscopy age 31 with h/o polyps  Refer in futurelast colonoscopy reviewedDr. Cidra sent for colonoscopy     DRE normal2/27/2020,  declined DRE 03/16/20 PSA 0.9 02/2021   Dermsaw 2022 and bx right thigh shave bx benign   Never smoker  rec healthy diet and exerciseHypertension, with   HTN with variable hypotension Losartan 100 mg with norvasc 2.5 mg qd Hyperlipidemia, unspecified hyperlipidemia type - Plan: Ambulatory referral to Cardiology On lipitor 10 mg qhs controlled  Hypernatremia - Plan: Comprehensive metabolic panel, Osmolality, urine, Osmolality, Sodium, urine, random, Microalbumin / creatinine urine ratio  Elevated blood protein - Plan: Comprehensive metabolic panel, Osmolality, urine, Osmolality, Sodium, urine, random, Microalbumin / creatinine urine ratio  Neck pain - Plan: cyclobenzaprine (FLEXERIL) 5 MG tablet  Erectile dysfunction, unspecified erectile dysfunction type - Plan: sildenafil (REVATIO) 20 MG tablet  Gastroesophageal reflux disease - Plan: pantoprazole (PROTONIX) 20 MG tablet     Provider: Dr. Olivia Mackie McLean-Scocuzza-Internal Medicine

## 2021-03-19 NOTE — Patient Instructions (Addendum)
Consider hepatitis B vaccine and MMR Consider Tdap vaccine    Dr. Debara Pickett or Dr. Sherren Mocha cardiology     [facility] [place]  [address]  [place] [phone]   [facility] [place]  [address] [place] [address]  [phone]

## 2021-03-20 ENCOUNTER — Encounter: Payer: Self-pay | Admitting: Internal Medicine

## 2021-03-20 LAB — SODIUM, URINE, RANDOM: Sodium, Ur: 58 mmol/L

## 2021-03-20 LAB — MICROALBUMIN / CREATININE URINE RATIO
Creatinine, Urine: 30.7 mg/dL
Microalb/Creat Ratio: <10 mg/g creat (ref 0–29)
Microalbumin, Urine: <3 ug/mL

## 2021-03-20 LAB — OSMOLALITY, URINE: Osmolality, Ur: 236 mOsmol/kg

## 2021-03-20 LAB — OSMOLALITY: Osmolality Meas: 288 mOsmol/kg (ref 275–295)

## 2021-04-06 ENCOUNTER — Encounter: Payer: Self-pay | Admitting: Cardiology

## 2021-04-06 NOTE — Telephone Encounter (Signed)
error 

## 2021-08-20 DIAGNOSIS — H04123 Dry eye syndrome of bilateral lacrimal glands: Secondary | ICD-10-CM | POA: Diagnosis not present

## 2021-08-20 DIAGNOSIS — H11432 Conjunctival hyperemia, left eye: Secondary | ICD-10-CM | POA: Diagnosis not present

## 2021-10-11 ENCOUNTER — Other Ambulatory Visit: Payer: Self-pay

## 2021-10-11 ENCOUNTER — Encounter: Payer: Self-pay | Admitting: Nurse Practitioner

## 2021-10-11 ENCOUNTER — Ambulatory Visit (INDEPENDENT_AMBULATORY_CARE_PROVIDER_SITE_OTHER): Payer: BC Managed Care – PPO | Admitting: Nurse Practitioner

## 2021-10-11 VITALS — BP 132/76 | HR 62 | Temp 97.5°F | Resp 12 | Ht 67.32 in | Wt 160.1 lb

## 2021-10-11 DIAGNOSIS — K30 Functional dyspepsia: Secondary | ICD-10-CM | POA: Insufficient documentation

## 2021-10-11 DIAGNOSIS — K219 Gastro-esophageal reflux disease without esophagitis: Secondary | ICD-10-CM | POA: Diagnosis not present

## 2021-10-11 LAB — CBC
HCT: 46.5 % (ref 39.0–52.0)
Hemoglobin: 15.7 g/dL (ref 13.0–17.0)
MCHC: 33.7 g/dL (ref 30.0–36.0)
MCV: 86.8 fl (ref 78.0–100.0)
Platelets: 198 10*3/uL (ref 150.0–400.0)
RBC: 5.36 Mil/uL (ref 4.22–5.81)
RDW: 13.4 % (ref 11.5–15.5)
WBC: 7.8 10*3/uL (ref 4.0–10.5)

## 2021-10-11 LAB — COMPREHENSIVE METABOLIC PANEL
ALT: 29 U/L (ref 0–53)
AST: 24 U/L (ref 0–37)
Albumin: 4.8 g/dL (ref 3.5–5.2)
Alkaline Phosphatase: 97 U/L (ref 39–117)
BUN: 12 mg/dL (ref 6–23)
CO2: 31 mEq/L (ref 19–32)
Calcium: 10.2 mg/dL (ref 8.4–10.5)
Chloride: 101 mEq/L (ref 96–112)
Creatinine, Ser: 1.12 mg/dL (ref 0.40–1.50)
GFR: 75.45 mL/min (ref >60.00)
Glucose, Bld: 94 mg/dL (ref 70–99)
Potassium: 5.3 mEq/L — ABNORMAL HIGH (ref 3.5–5.1)
Sodium: 139 mEq/L (ref 135–145)
Total Bilirubin: 0.9 mg/dL (ref 0.2–1.2)
Total Protein: 7.1 g/dL (ref 6.0–8.3)

## 2021-10-11 LAB — LIPASE: Lipase: 19 U/L (ref 11.0–59.0)

## 2021-10-11 MED ORDER — FAMOTIDINE 20 MG PO TABS: 20.0000 mg | ORAL_TABLET | Freq: Two times a day (BID) | ORAL | 0 refills | Status: DC

## 2021-10-11 MED ORDER — ALUM & MAG HYDROXIDE-SIMETH 200-200-20 MG/5ML PO SUSP
10.0000 mL | Freq: Once | ORAL | Status: AC
Start: 2021-10-11 — End: 2021-10-11
  Administered 2021-10-11: 10 mL via ORAL

## 2021-10-11 MED ORDER — PANTOPRAZOLE SODIUM 20 MG PO TBEC: 20.0000 mg | DELAYED_RELEASE_TABLET | Freq: Two times a day (BID) | ORAL | 0 refills | Status: DC

## 2021-10-11 NOTE — Progress Notes (Signed)
Acute Office Visit  Subjective:    Patient ID: Anthony Patton., male    DOB: 07/15/1971, 50 y.o.   MRN: [id number]  Chief Complaint  Patient presents with   Heartburn    X 1 week. Burps a lot more than usual, sensation described as a pain in the mid middle chest area. 80% all the time present. He is taking Pantoprazole 20 mg daily. He did take 40 mg this morning. Bowel movements have been more softer than usual and more darker brown color, not black tarr color. No blood noticed.    Heartburn He complains of chest pain and heartburn. He reports no coughing or no nausea.  Patient is in today for Heart burn  Symptoms started approx 1-1.5 weeks ago.. States he does have break through heartburn at night States escalated the past week Has tired tums with minimal benefit. States improvement but did not last long More burping history of high blood pressure.  Exercise. Walks a few miles every day along with stationary bike Burning sensation No change in sensation with position.  States that his stools have been a darker color since he has been eating blueberries   Past Medical History:  Diagnosis Date   Allergy    Chicken pox    Colon polyps    Gastritis    with esophagitis    GERD (gastroesophageal reflux disease)    Hyperlipidemia    Hypertension    Low back pain     Past Surgical History:  Procedure Laterality Date   APPENDECTOMY     COLONOSCOPY W/ POLYPECTOMY     KNEE SURGERY     arthroscopy x 2 torn cartilage    LASIK     b/l   TONSILLECTOMY      Family History  Problem Relation Age of Onset   Diabetes Mother    Hypertension Mother    Heart attack Mother        mid 27s   Diabetes Father    Hypertension Father    Hyperlipidemia Father    Colon polyps Father        precancerous    Diabetes Maternal Grandmother    Heart disease Maternal Grandfather        MI age 56s-50s    Hyperlipidemia Paternal Grandmother    Valvular heart disease Paternal  Grandmother    Diabetes Paternal Grandfather    Cancer Paternal Grandfather        lung cancer smoker    Heart attack Paternal Grandfather        in age 82s     Social History   Socioeconomic History   Marital status: Married    Spouse name: Not on file   Number of children: Not on file   Years of education: Not on file   Highest education level: Not on file  Occupational History   Not on file  Tobacco Use   Smoking status: Never   Smokeless tobacco: Never  Substance and Sexual Activity   Alcohol use: Yes   Drug use: Not Currently   Sexual activity: Yes  Other Topics Concern   Not on file  Social History Narrative   Married    Moved from [place] in [employer] degree   Kids x2   No guns   Wears seat belt    Safe in relationship    Social Determinants of Health   Financial Resource Strain: Not on file  Food Insecurity: Not on file  Transportation Needs: Not on file  Physical Activity: Not on file  Stress: Not on file  Social Connections: Not on file  Intimate Partner Violence: Not on file    Outpatient Medications Prior to Visit  Medication Sig Dispense Refill   amLODipine (NORVASC) 2.5 MG tablet Take 1 tablet (2.5 mg total) by mouth in the morning and at bedtime. For BP >130/>80 take an additional 2.5 mg dose 180 tablet 3   Ascorbic Acid (VITAMIN C PO) Take by mouth.     atorvastatin (LIPITOR) 10 MG tablet Take 1 tablet (10 mg total) by mouth daily at 6 PM. 90 tablet 3   cyclobenzaprine (FLEXERIL) 5 MG tablet Take 1 tablet (5 mg total) by mouth at bedtime as needed for muscle spasms. 30 tablet 2   fexofenadine (ALLEGRA) 180 MG tablet Take 180 mg by mouth daily as needed for allergies or rhinitis.     hydrOXYzine (ATARAX/VISTARIL) 25 MG tablet Take 1-2 tablets (25-50 mg total) by mouth at bedtime as needed. Or BID prn 180 tablet 3   losartan (COZAAR) 100 MG tablet Take 1 tablet (100 mg total) by mouth daily. 90 tablet 3   LYSINE PO Take by mouth.      Multiple Vitamin (MULTIVITAMIN) capsule Take 1 capsule by mouth daily.     pantoprazole (PROTONIX) 20 MG tablet Take 1 tablet (20 mg total) by mouth daily. 30 minutes before food 90 tablet 3   sildenafil (REVATIO) 20 MG tablet Take 1-5 tablets (20-100 mg total) by mouth daily as needed. 30 tablet 11   MAGNESIUM PO Take by mouth. (Patient not taking: Reported on 03/19/2021)     Multiple Vitamins-Minerals (ZINC PO) Take by mouth. (Patient not taking: Reported on 03/19/2021)     No facility-administered medications prior to visit.    Allergies  Allergen Reactions   Hctz [Hydrochlorothiazide]     Dizziness     Penicillins Rash    Rash as chid but able to tolerate amoxicillin     Review of Systems  Constitutional:  Negative for chills and fever.  Respiratory:  Negative for cough and shortness of breath.   Cardiovascular:  Positive for chest pain.  Gastrointestinal:  Positive for heartburn. Negative for blood in stool, diarrhea, nausea and vomiting.       More belching  Skin:  Negative for color change and pallor.      Objective:    Physical Exam Constitutional:      Appearance: Normal appearance.  Cardiovascular:     Rate and Rhythm: Normal rate and regular rhythm.     Pulses: Normal pulses.  Pulmonary:     Effort: Pulmonary effort is normal.     Breath sounds: Normal breath sounds.  Abdominal:     General: Bowel sounds are normal.     Tenderness: There is no abdominal tenderness.     Comments: Has had some discomfort to LUQ  Neurological:     Mental Status: He is alert.  Psychiatric:        Mood and Affect: Mood normal.        Behavior: Behavior normal.        Thought Content: Thought content normal.        Judgment: Judgment normal.    BP 132/76   Pulse 62   Temp (!) 97.5 F (36.4 C)   Resp 12   Ht 5' 7.32" (1.71 m)   Wt 160 lb 2 oz (72.6 kg)   SpO2 98%   BMI 24.84  kg/m  Wt Readings from Last 3 Encounters:  10/11/21 160 lb 2 oz (72.6 kg)  03/19/21 165 lb 3.2  oz (74.9 kg)  12/11/20 163 lb (73.9 kg)    Health Maintenance Due  Topic Date Due   HIV Screening  Never done   TETANUS/TDAP  Never done   Zoster Vaccines- Shingrix (1 of 2) Never done   COVID-19 Vaccine (4 - Booster for Moderna series) 03/14/2021   INFLUENZA VACCINE  07/29/2021    There are no preventive care reminders to display for this patient.   Lab Results  Component Value Date   TSH 1.270 03/12/2021   Lab Results  Component Value Date   WBC 5.9 03/12/2021   HGB 16.1 03/12/2021   HCT 47.1 03/12/2021   MCV 86 03/12/2021   PLT 205 03/12/2021   Lab Results  Component Value Date   NA 139 03/19/2021   K 5.1 03/19/2021   CO2 28 03/19/2021   GLUCOSE 89 03/19/2021   BUN 14 03/19/2021   CREATININE 1.16 03/19/2021   BILITOT 1.0 03/19/2021   ALKPHOS 86 03/19/2021   AST 32 03/19/2021   ALT 31 03/19/2021   PROT 6.6 03/19/2021   ALBUMIN 4.6 03/19/2021   CALCIUM 9.4 03/19/2021   ANIONGAP 6 02/27/2020   EGFR 80 03/12/2021   GFR 72.62 03/19/2021   Lab Results  Component Value Date   CHOL 153 03/12/2021   Lab Results  Component Value Date   HDL 47 03/12/2021   Lab Results  Component Value Date   LDLCALC 86 03/12/2021   Lab Results  Component Value Date   TRIG 111 03/12/2021   Lab Results  Component Value Date   CHOLHDL 3.3 03/12/2021   No results found for: HGBA1C     Assessment & Plan:   Problem List Items Addressed This Visit       Digestive   Gastroesophageal reflux disease    Patient has history of GERD in which she was treated with Protonix 20 mg.  States well controlled until this past week to week and a half its been uncontrolled has tried over-the-counter treatments without great relief.  Did take 40 mg Protonix this morning without much benefit as of yet.  Does have history of having [place]op[place].  States he had a colonoscopy in December 2021 and did not have an endoscopy.  He is established with Dr. Rayetta Pigg  with [facility].  Did administer Maalox in office and helped some with patient's GERD symptoms we will increase his PPI to 40 mg daily in split dosing along with famotidine for the next couple weeks did tell patient to call and schedule with GI if no improvement for maybe upper GI series did give return signs and symptoms and review things that he as to when he needs to be seen in the emergency or urgent care setting.  No cardiac sees personally.  Does have lipid and hypertension.  EKG normal in office and in comparison to 1 previous when he saw cardiology.  Low suspicion it is cardiac in etiology.  Also keep on radar of possible esophagitis continue to monitor      Relevant Medications   famotidine (PEPCID) 20 MG tablet   pantoprazole (PROTONIX) 20 MG tablet     Other   Indigestion - Primary    And burning sensation midsternal chest.  Tums has helped reduce discomfort but not completely taken away.  Maalox in office to help  reduce discomfort but not totally take it away.  Did discuss things to avoid in regards to indigestion and GERD.  We will check some basic blood work as he did say he had some left upper quadrant discomfort in the past and none on palpation today.  EKG normal in office.  Signs and symptoms reviewed to seek emergent care.  Patient acknowledged      Relevant Medications   famotidine (PEPCID) 20 MG tablet   pantoprazole (PROTONIX) 20 MG tablet   Other Relevant Orders   EKG 12-Lead (Completed)   Comprehensive metabolic panel (Completed)   Lipase (Completed)   CBC (Completed)     No orders of the defined types were placed in this encounter.  This visit occurred during the SARS-CoV-2 public health emergency.  Safety protocols were in place, including screening questions prior to the visit, additional usage of staff PPE, and extensive cleaning of exam room while observing appropriate contact time as indicated for disinfecting solutions.   Romilda Garret, NP

## 2021-10-11 NOTE — Patient Instructions (Addendum)
Nice to see you today If the pain changes, you become short of breath, get cold sweats you need to be seen in Urgent care or Emergency department [phone] Dr Haig Prophet

## 2021-10-11 NOTE — Assessment & Plan Note (Signed)
And burning sensation midsternal chest.  Tums has helped reduce discomfort but not completely taken away.  Maalox in office to help reduce discomfort but not totally take it away.  Did discuss things to avoid in regards to indigestion and GERD.  We will check some basic blood work as he did say he had some left upper quadrant discomfort in the past and none on palpation today.  EKG normal in office.  Signs and symptoms reviewed to seek emergent care.  Patient acknowledged

## 2021-10-11 NOTE — Assessment & Plan Note (Signed)
Patient has history of GERD in which she was treated with Protonix 20 mg.  States well controlled until this past week to week and a half its been uncontrolled has tried over-the-counter treatments without great relief.  Did take 40 mg Protonix this morning without much benefit as of yet.  Does have history of having [place]oscop[place].  States he had a colonoscopy in December 2021 and did not have an endoscopy.  He is established with Dr. Rayetta Pigg with [facility].  Did administer Maalox in office and helped some with patient's GERD symptoms we will increase his PPI to 40 mg daily in split dosing along with famotidine for the next couple weeks did tell patient to call and schedule with GI if no improvement for maybe upper GI series did give return signs and symptoms and review things that he as to when he needs to be seen in the emergency or urgent care setting.  No cardiac sees personally.  Does have lipid and hypertension.  EKG normal in office and in comparison to 1 previous when he saw cardiology.  Low suspicion it is cardiac in etiology.  Also keep on radar of possible esophagitis continue to monitor

## 2021-10-14 ENCOUNTER — Encounter: Payer: Self-pay | Admitting: Nurse Practitioner

## 2021-10-14 NOTE — Telephone Encounter (Signed)
Matt, please see my response to the patient above. Thank you

## 2021-11-06 DIAGNOSIS — K219 Gastro-esophageal reflux disease without esophagitis: Secondary | ICD-10-CM | POA: Diagnosis not present

## 2021-11-08 NOTE — Telephone Encounter (Signed)
I didn't realize I was suppose to respond to the patient. Whenever he can get in is ok

## 2021-11-08 NOTE — Telephone Encounter (Signed)
I didn't realize you had wanted me to respond to the patient. But whenever he can get in is ok. Can also follow up with is PCP in the meantime if he needs too.

## 2021-11-08 NOTE — Telephone Encounter (Signed)
See chart, patient was seen by GI on 11/06/21 and scheduled for Upper GI on 01/02/21

## 2021-11-24 ENCOUNTER — Other Ambulatory Visit: Payer: Self-pay | Admitting: Internal Medicine

## 2021-11-24 DIAGNOSIS — I10 Essential (primary) hypertension: Secondary | ICD-10-CM

## 2021-12-09 DIAGNOSIS — D225 Melanocytic nevi of trunk: Secondary | ICD-10-CM | POA: Diagnosis not present

## 2021-12-09 DIAGNOSIS — L570 Actinic keratosis: Secondary | ICD-10-CM | POA: Diagnosis not present

## 2021-12-09 DIAGNOSIS — D2262 Melanocytic nevi of left upper limb, including shoulder: Secondary | ICD-10-CM | POA: Diagnosis not present

## 2021-12-09 DIAGNOSIS — D2261 Melanocytic nevi of right upper limb, including shoulder: Secondary | ICD-10-CM | POA: Diagnosis not present

## 2021-12-09 DIAGNOSIS — D2272 Melanocytic nevi of left lower limb, including hip: Secondary | ICD-10-CM | POA: Diagnosis not present

## 2021-12-09 DIAGNOSIS — X32XXXA Exposure to sunlight, initial encounter: Secondary | ICD-10-CM | POA: Diagnosis not present

## 2021-12-29 DIAGNOSIS — R0789 Other chest pain: Secondary | ICD-10-CM | POA: Diagnosis not present

## 2022-01-02 DIAGNOSIS — K317 Polyp of stomach and duodenum: Secondary | ICD-10-CM | POA: Diagnosis not present

## 2022-01-02 DIAGNOSIS — K449 Diaphragmatic hernia without obstruction or gangrene: Secondary | ICD-10-CM | POA: Diagnosis not present

## 2022-01-02 DIAGNOSIS — K2100 Gastro-esophageal reflux disease with esophagitis, without bleeding: Secondary | ICD-10-CM | POA: Diagnosis not present

## 2022-01-02 DIAGNOSIS — K2950 Unspecified chronic gastritis without bleeding: Secondary | ICD-10-CM | POA: Diagnosis not present

## 2022-01-10 ENCOUNTER — Telehealth: Payer: Self-pay | Admitting: Internal Medicine

## 2022-01-10 ENCOUNTER — Encounter: Payer: Self-pay | Admitting: Internal Medicine

## 2022-01-10 NOTE — Telephone Encounter (Signed)
Pt is requesting a new referral to a different cardiologist than he seen before. Pt was not pleased with previous cardiologist he was referred to. Pt is having a lot of trouble with his BP. He will have a reading as high as 170/100 then later on that evening will have a BP of 97/56. Pt states he normally can tell when it's happening. He experiences symptoms of dizziness and light headedness. Pt is not currently in this moment feeling dizzy however he would like a call back from nurse regarding this. Pt declined access nurse due to work meeting he had in the next 10 minutes.

## 2022-01-13 NOTE — Addendum Note (Signed)
Addended by: Orland Mustard on: 01/13/2022 03:28 PM   Modules accepted: Orders

## 2022-01-13 NOTE — Telephone Encounter (Signed)
See 01/10/22 Patient message encounter

## 2022-01-13 NOTE — Telephone Encounter (Signed)
Please advise on new referral  ?

## 2022-01-15 ENCOUNTER — Telehealth: Payer: Self-pay | Admitting: Cardiology

## 2022-01-15 NOTE — Telephone Encounter (Signed)
Patient is requesting to switch from Dr. Garen Lah to Dr. Ali Lowe. Please advise.

## 2022-01-16 ENCOUNTER — Encounter: Payer: Self-pay | Admitting: Internal Medicine

## 2022-01-16 DIAGNOSIS — E785 Hyperlipidemia, unspecified: Secondary | ICD-10-CM

## 2022-01-17 MED ORDER — ATORVASTATIN CALCIUM 10 MG PO TABS: 10.0000 mg | ORAL_TABLET | Freq: Every day | ORAL | 3 refills | Status: DC

## 2022-01-20 NOTE — Telephone Encounter (Signed)
Patient is now want to be switch to  Dr. Agustin Cree. Please advise

## 2022-01-24 ENCOUNTER — Encounter: Payer: Self-pay | Admitting: Internal Medicine

## 2022-02-21 DIAGNOSIS — E785 Hyperlipidemia, unspecified: Secondary | ICD-10-CM | POA: Diagnosis not present

## 2022-02-21 DIAGNOSIS — R0789 Other chest pain: Secondary | ICD-10-CM | POA: Diagnosis not present

## 2022-02-21 DIAGNOSIS — I10 Essential (primary) hypertension: Secondary | ICD-10-CM | POA: Diagnosis not present

## 2022-03-07 ENCOUNTER — Ambulatory Visit: Payer: BC Managed Care – PPO | Admitting: Cardiology

## 2022-03-20 ENCOUNTER — Other Ambulatory Visit: Payer: Self-pay

## 2022-03-20 ENCOUNTER — Encounter: Payer: Self-pay | Admitting: Internal Medicine

## 2022-03-20 ENCOUNTER — Ambulatory Visit (INDEPENDENT_AMBULATORY_CARE_PROVIDER_SITE_OTHER): Payer: BC Managed Care – PPO | Admitting: Internal Medicine

## 2022-03-20 VITALS — BP 112/84 | HR 64 | Temp 97.8°F | Ht 67.4 in | Wt 160.0 lb

## 2022-03-20 DIAGNOSIS — E559 Vitamin D deficiency, unspecified: Secondary | ICD-10-CM | POA: Diagnosis not present

## 2022-03-20 DIAGNOSIS — E538 Deficiency of other specified B group vitamins: Secondary | ICD-10-CM | POA: Diagnosis not present

## 2022-03-20 DIAGNOSIS — R1032 Left lower quadrant pain: Secondary | ICD-10-CM

## 2022-03-20 DIAGNOSIS — E611 Iron deficiency: Secondary | ICD-10-CM | POA: Diagnosis not present

## 2022-03-20 DIAGNOSIS — Z125 Encounter for screening for malignant neoplasm of prostate: Secondary | ICD-10-CM

## 2022-03-20 DIAGNOSIS — E291 Testicular hypofunction: Secondary | ICD-10-CM

## 2022-03-20 DIAGNOSIS — R5383 Other fatigue: Secondary | ICD-10-CM | POA: Diagnosis not present

## 2022-03-20 DIAGNOSIS — R0683 Snoring: Secondary | ICD-10-CM

## 2022-03-20 DIAGNOSIS — Z13220 Encounter for screening for lipoid disorders: Secondary | ICD-10-CM | POA: Diagnosis not present

## 2022-03-20 DIAGNOSIS — Z0001 Encounter for general adult medical examination with abnormal findings: Secondary | ICD-10-CM

## 2022-03-20 DIAGNOSIS — Z1389 Encounter for screening for other disorder: Secondary | ICD-10-CM

## 2022-03-20 DIAGNOSIS — Z23 Encounter for immunization: Secondary | ICD-10-CM

## 2022-03-20 LAB — COMPREHENSIVE METABOLIC PANEL
ALT: 34 U/L (ref 0–53)
AST: 26 U/L (ref 0–37)
Albumin: 4.8 g/dL (ref 3.5–5.2)
Alkaline Phosphatase: 93 U/L (ref 39–117)
BUN: 19 mg/dL (ref 6–23)
CO2: 33 mEq/L — ABNORMAL HIGH (ref 19–32)
Calcium: 9.6 mg/dL (ref 8.4–10.5)
Chloride: 103 mEq/L (ref 96–112)
Creatinine, Ser: 1.09 mg/dL (ref 0.40–1.50)
GFR: 77.71 mL/min (ref >60.00)
Glucose, Bld: 88 mg/dL (ref 70–99)
Potassium: 4.7 mEq/L (ref 3.5–5.1)
Sodium: 141 mEq/L (ref 135–145)
Total Bilirubin: 0.8 mg/dL (ref 0.2–1.2)
Total Protein: 6.8 g/dL (ref 6.0–8.3)

## 2022-03-20 LAB — LIPID PANEL
Cholesterol: 142 mg/dL (ref 0–200)
HDL: 50.4 mg/dL (ref >39.00)
LDL Cholesterol: 76 mg/dL (ref 0–99)
NonHDL: 91.3
Total CHOL/HDL Ratio: 3
Triglycerides: 77 mg/dL (ref 0.0–149.0)
VLDL: 15.4 mg/dL (ref 0.0–40.0)

## 2022-03-20 LAB — CBC WITH DIFFERENTIAL/PLATELET
Basophils Absolute: 0.1 10*3/uL (ref 0.0–0.1)
Basophils Relative: 0.9 % (ref 0.0–3.0)
Eosinophils Absolute: 0.2 10*3/uL (ref 0.0–0.7)
Eosinophils Relative: 2.3 % (ref 0.0–5.0)
HCT: 45.3 % (ref 39.0–52.0)
Hemoglobin: 15.6 g/dL (ref 13.0–17.0)
Lymphocytes Relative: 14.5 % (ref 12.0–46.0)
Lymphs Abs: 1.2 10*3/uL (ref 0.7–4.0)
MCHC: 34.4 g/dL (ref 30.0–36.0)
MCV: 86.7 fl (ref 78.0–100.0)
Monocytes Absolute: 0.4 10*3/uL (ref 0.1–1.0)
Monocytes Relative: 5 % (ref 3.0–12.0)
Neutro Abs: 6.3 10*3/uL (ref 1.4–7.7)
Neutrophils Relative %: 77.3 % — ABNORMAL HIGH (ref 43.0–77.0)
Platelets: 169 10*3/uL (ref 150.0–400.0)
RBC: 5.22 Mil/uL (ref 4.22–5.81)
RDW: 13.1 % (ref 11.5–15.5)
WBC: 8.1 10*3/uL (ref 4.0–10.5)

## 2022-03-20 LAB — PSA: PSA: 0.75 ng/mL (ref 0.10–4.00)

## 2022-03-20 LAB — IBC + FERRITIN
Ferritin: 86.9 ng/mL (ref 22.0–322.0)
Iron: 86 ug/dL (ref 42–165)
Saturation Ratios: 26.3 % (ref 20.0–50.0)
TIBC: 327.6 ug/dL (ref 250.0–450.0)
Transferrin: 234 mg/dL (ref 212.0–360.0)

## 2022-03-20 LAB — VITAMIN D 25 HYDROXY (VIT D DEFICIENCY, FRACTURES): VITD: 42.23 ng/mL (ref 30.00–100.00)

## 2022-03-20 LAB — VITAMIN B12: Vitamin B-12: 591 pg/mL (ref 211–911)

## 2022-03-20 LAB — MAGNESIUM: Magnesium: 2.2 mg/dL (ref 1.5–2.5)

## 2022-03-20 LAB — TSH: TSH: 0.84 u[IU]/mL (ref 0.35–5.50)

## 2022-03-20 LAB — TESTOSTERONE: Testosterone: 331.87 ng/dL (ref 300.00–890.00)

## 2022-03-20 MED ORDER — TETANUS-DIPHTH-ACELL PERTUSSIS 5-2.5-18.5 LF-MCG/0.5 IM SUSP: 0.5000 mL | Freq: Once | INTRAMUSCULAR | 0 refills | Status: AC

## 2022-03-20 NOTE — Progress Notes (Signed)
Chief Complaint  ?Patient presents with  ? Annual Exam  ? ?Annual  ?1. Htn dbp sl elevated today on norvasc 2.5 mg qd and losartan 100 mg qd  ?Variable BP s/p stress test pending cards 03/28/22 and f/u 05/15/22 [facility] cards  ?2. Snoring and fatigue  ?3. GERd disc protonix and long tern side effects and could try pepcid  ?4. C/o LLQ ab pain with h/o diverticulosis  ?No pain today but with ttp pain on exam ? ?Review of Systems  ?Constitutional:  Negative for weight loss.  ?HENT:  Negative for hearing loss.   ?Eyes:  Negative for blurred vision.  ?Respiratory:  Negative for shortness of breath.   ?Cardiovascular:  Negative for chest pain.  ?Gastrointestinal:  Positive for abdominal pain. Negative for blood in stool.  ?Musculoskeletal:  Negative for back pain.  ?Skin:  Negative for rash.  ?Neurological:  Negative for headaches.  ?Psychiatric/Behavioral:  Negative for depression.   ?Past Medical History:  ?Diagnosis Date  ? Allergy   ? Chicken pox   ? Chronic gastritis   ? egd [facility] gi Dr. Haig Prophet 01/02/22 with fundic gastric polyps  ? Colon polyps   ? Gastritis   ? with esophagitis   ? GERD (gastroesophageal reflux disease)   ? Hyperlipidemia   ? Hypertension   ? Low back pain   ? ?Past Surgical History:  ?Procedure Laterality Date  ? APPENDECTOMY    ? COLONOSCOPY W/ POLYPECTOMY    ? KNEE SURGERY    ? arthroscopy x 2 torn cartilage   ? LASIK    ? b/l  ? TONSILLECTOMY    ? ?Family History  ?Problem Relation Age of Onset  ? Diabetes Mother   ? Hypertension Mother   ? Heart attack Mother   ?     mid 24s  ? Diabetes Father   ? Hypertension Father   ? Hyperlipidemia Father   ? Colon polyps Father   ?     precancerous   ? Diabetes Maternal Grandmother   ? Heart disease Maternal Grandfather   ?     MI age 65s-50s   ? Hyperlipidemia Paternal Grandmother   ? Valvular heart disease Paternal Grandmother   ? Diabetes Paternal Grandfather   ? Cancer Paternal Grandfather   ?     lung cancer smoker   ? Heart attack Paternal Grandfather   ?      in age 50s   ? ?Social History  ? ?Socioeconomic History  ? Marital status: Married  ?  Spouse name: Not on file  ? Number of children: Not on file  ? Years of education: Not on file  ? Highest education level: Not on file  ?Occupational History  ? Not on file  ?Tobacco Use  ? Smoking status: Never  ? Smokeless tobacco: Never  ?Substance and Sexual Activity  ? Alcohol use: Yes  ? Drug use: Not Currently  ? Sexual activity: Yes  ?Other Topics Concern  ? Not on file  ?Social History Narrative  ? Married   ? Moved from [place] in [place]   ? Associates degree  ? Kids x2  ? No guns  ? Wears seat belt   ? Safe in relationship   ? ?Social Determinants of Health  ? ?Financial Resource Strain: Not on file  ?Food Insecurity: Not on file  ?Transportation Needs: Not on file  ?Physical Activity: Not on file  ?Stress: Not on file  ?Social Connections: Not on file  ?Intimate Partner  Violence: Not on file  ? ?Current Meds  ?Medication Sig  ? amLODipine (NORVASC) 2.5 MG tablet TAKE ONE TABLET BY MOUTH EVERY MORNING AND TAKE ONE TABLET BY MOUTH EVERY NIGHT AT BEDTIME (FOR BP>130/>80, TAKE AN ADDITIONAL TABLET)  ? Ascorbic Acid (VITAMIN C PO) Take by mouth.  ? atorvastatin (LIPITOR) 10 MG tablet Take 1 tablet (10 mg total) by mouth daily at 6 PM.  ? cyclobenzaprine (FLEXERIL) 5 MG tablet Take 1 tablet (5 mg total) by mouth at bedtime as needed for muscle spasms.  ? famotidine (PEPCID) 20 MG tablet Take 1 tablet (20 mg total) by mouth 2 (two) times daily.  ? fexofenadine (ALLEGRA) 180 MG tablet Take 180 mg by mouth daily as needed for allergies or rhinitis.  ? hydrOXYzine (ATARAX/VISTARIL) 25 MG tablet Take 1-2 tablets (25-50 mg total) by mouth at bedtime as needed. Or BID prn  ? losartan (COZAAR) 100 MG tablet Take 1 tablet (100 mg total) by mouth daily.  ? LYSINE PO Take by mouth.  ? Multiple Vitamin (MULTIVITAMIN) capsule Take 1 capsule by mouth daily.  ? pantoprazole (PROTONIX) 20 MG tablet Take 1 tablet (20 mg total) by mouth daily. 30  minutes before food  ? sildenafil (REVATIO) 20 MG tablet Take 1-5 tablets (20-100 mg total) by mouth daily as needed.  ? Tdap (BOOSTRIX) 5-2.5-18.5 LF-MCG/0.5 injection Inject 0.5 mLs into the muscle once for 1 dose.  ? [DISCONTINUED] pantoprazole (PROTONIX) 20 MG tablet Take 1 tablet (20 mg total) by mouth 2 (two) times daily.  ? ?Allergies  ?Allergen Reactions  ? Hctz [Hydrochlorothiazide]   ?  Dizziness  ?  ? Penicillins Rash  ?  Rash as chid but able to tolerate amoxicillin ?  ? ?No results found for this or any previous visit (from the past 2160 hour(s)). ?Objective  ?Body mass index is 24.76 kg/m?. ?Wt Readings from Last 3 Encounters:  ?03/20/22 160 lb (72.6 kg)  ?10/11/21 160 lb 2 oz (72.6 kg)  ?03/19/21 165 lb 3.2 oz (74.9 kg)  ? ?Temp Readings from Last 3 Encounters:  ?03/20/22 97.8 ?F (36.6 ?C) (Oral)  ?10/11/21 (!) 97.5 ?F (36.4 ?C)  ?03/19/21 97.6 ?F (36.4 ?C) (Oral)  ? ?BP Readings from Last 3 Encounters:  ?03/20/22 112/84  ?10/11/21 132/76  ?03/19/21 122/84  ? ?Pulse Readings from Last 3 Encounters:  ?03/20/22 64  ?10/11/21 62  ?03/19/21 69  ? ? ?Physical Exam ?Vitals and nursing note reviewed.  ?Constitutional:   ?   Appearance: Normal appearance. He is well-developed and well-groomed.  ?HENT:  ?   Head: Normocephalic and atraumatic.  ?Eyes:  ?   Conjunctiva/sclera: Conjunctivae normal.  ?   Pupils: Pupils are equal, round, and reactive to light.  ?Cardiovascular:  ?   Rate and Rhythm: Normal rate and regular rhythm.  ?   Heart sounds: Normal heart sounds.  ?Pulmonary:  ?   Effort: Pulmonary effort is normal. No respiratory distress.  ?   Breath sounds: Normal breath sounds.  ?Abdominal:  ?   Tenderness: There is abdominal tenderness in the left lower quadrant.  ?Skin: ?   General: Skin is warm and moist.  ?Neurological:  ?   General: No focal deficit present.  ?   Mental Status: He is alert and oriented to person, place, and time. Mental status is at baseline.  ?   Sensory: Sensation is intact.  ?    Motor: Motor function is intact.  ?   Coordination: Coordination is intact.  ?  Gait: Gait is intact. Gait normal.  ?Psychiatric:     ?   Attention and Perception: Attention and perception normal.     ?   Mood and Affect: Mood and affect normal.     ?   Speech: Speech normal.     ?   Behavior: Behavior normal. Behavior is cooperative.     ?   Thought Content: Thought content normal.     ?   Cognition and Memory: Cognition and memory normal.     ?   Judgment: Judgment normal.  ? ? ?Assessment  ?Plan  ?Abnormal physical evaluation - Plan: Comprehensive metabolic panel, Lipid panel, CBC with Differential/Platelet, TSH, Urinalysis, Routine w reflex microscopic, PSA, Testosterone, IBC + Ferritin, Vitamin B12, Vitamin D (25 hydroxy) ? ?Need for Tdap vaccination - Plan: Tdap (BOOSTRIX) 5-2.5-18.5 LF-MCG/0.5 injection ?See below  ? ?Fatigue, pending sleep study- Plan: Comprehensive metabolic panel, CBC with Differential/Platelet, TSH, Urinalysis, Routine w reflex microscopic ? ?Left lower quadrant abdominal pain - Plan: CT ABDOMEN PELVIS WO CONTRAST ? ? ?Snoring - Plan: Ambulatory referral to Pulmonology home sleep study ? ?HM ?Flu shot utd ?Tdap given Rx today  ?Consider shingrix vaccine  ?Needs hep B and MMR vaccine  ?covid 19 vx had 5/5  ?  ?Check colonoscopy old PCP had x 2 dad h/o polyps precancerous had age 54 and 6 , also had labs 07/2018 ?-no colonoscopy in records per pt had 2-3 colonoscopies in the past h/o polyp removed small  ?had last colonoscopy age 41 with h/o polyps  ? last colonoscopy reviewed   Dr. Alice Reichert ROI sent for colonoscopy due 12/13/20 due in 5 years ?EGD 12/2021 need ROI ?  ?  ?  ?PSA today  ?  ?Derm saw 2022 and bx right thigh shave bx benign  ?  ?Never smoker  ?rec healthy diet and exercise  ? ?Provider: Dr. Olivia Mackie McLean-Scocuzza-Internal Medicine  ?

## 2022-03-21 LAB — URINALYSIS, ROUTINE W REFLEX MICROSCOPIC
Bilirubin Urine: NEGATIVE
Glucose, UA: NEGATIVE
Hgb urine dipstick: NEGATIVE
Ketones, ur: NEGATIVE
Leukocytes,Ua: NEGATIVE
Nitrite: NEGATIVE
Protein, ur: NEGATIVE
Specific Gravity, Urine: 1.02 (ref 1.001–1.035)
pH: 7.5 (ref 5.0–8.0)

## 2022-03-24 ENCOUNTER — Encounter: Payer: Self-pay | Admitting: Internal Medicine

## 2022-03-25 ENCOUNTER — Other Ambulatory Visit: Payer: Self-pay

## 2022-03-25 DIAGNOSIS — F419 Anxiety disorder, unspecified: Secondary | ICD-10-CM

## 2022-03-25 MED ORDER — HYDROXYZINE HCL 25 MG PO TABS: 25.0000 mg | ORAL_TABLET | Freq: Every evening | ORAL | 3 refills | Status: DC | PRN

## 2022-03-28 DIAGNOSIS — R0789 Other chest pain: Secondary | ICD-10-CM | POA: Diagnosis not present

## 2022-03-31 ENCOUNTER — Encounter: Payer: Self-pay | Admitting: Internal Medicine

## 2022-03-31 NOTE — Telephone Encounter (Signed)
These scheduling questions for imaging should be forward to [place]  ?But yes this should be ok to change  ?

## 2022-04-02 ENCOUNTER — Ambulatory Visit: Payer: BC Managed Care – PPO

## 2022-04-11 ENCOUNTER — Encounter: Payer: Self-pay | Admitting: Internal Medicine

## 2022-04-11 DIAGNOSIS — K7689 Other specified diseases of liver: Secondary | ICD-10-CM | POA: Diagnosis not present

## 2022-04-11 DIAGNOSIS — N281 Cyst of kidney, acquired: Secondary | ICD-10-CM | POA: Diagnosis not present

## 2022-04-13 ENCOUNTER — Encounter: Payer: Self-pay | Admitting: Internal Medicine

## 2022-04-13 ENCOUNTER — Telehealth: Payer: Self-pay | Admitting: Internal Medicine

## 2022-04-13 NOTE — Telephone Encounter (Signed)
Benign liver cysts and 4.8 cm left kidney cyst noted on ct ab/pelvis [facility] 04/11/22  ?No reason for abdominal pain  ? ?

## 2022-04-14 ENCOUNTER — Encounter: Payer: Self-pay | Admitting: Internal Medicine

## 2022-04-14 DIAGNOSIS — K2970 Gastritis, unspecified, without bleeding: Secondary | ICD-10-CM | POA: Insufficient documentation

## 2022-04-14 DIAGNOSIS — K449 Diaphragmatic hernia without obstruction or gangrene: Secondary | ICD-10-CM | POA: Insufficient documentation

## 2022-04-18 ENCOUNTER — Other Ambulatory Visit: Payer: Self-pay | Admitting: Internal Medicine

## 2022-04-18 DIAGNOSIS — I10 Essential (primary) hypertension: Secondary | ICD-10-CM

## 2022-04-23 ENCOUNTER — Other Ambulatory Visit: Payer: Self-pay | Admitting: Internal Medicine

## 2022-04-23 DIAGNOSIS — K219 Gastro-esophageal reflux disease without esophagitis: Secondary | ICD-10-CM

## 2022-05-02 ENCOUNTER — Encounter: Payer: Self-pay | Admitting: Primary Care

## 2022-05-02 ENCOUNTER — Ambulatory Visit (INDEPENDENT_AMBULATORY_CARE_PROVIDER_SITE_OTHER): Payer: BC Managed Care – PPO | Admitting: Primary Care

## 2022-05-02 VITALS — BP 122/72 | HR 51 | Temp 97.8°F | Ht 66.0 in | Wt 159.0 lb

## 2022-05-02 DIAGNOSIS — R0683 Snoring: Secondary | ICD-10-CM

## 2022-05-02 NOTE — Patient Instructions (Signed)
?Untreated sleep apnea but due to high risk for cardiac arrhythmias, stroke, pulmonary hypertension or diabetes ? ?Treatment options include weight management, oral appliance, CPAP therapy or referral to ENT for possible surgical options ?  ?Recommendations ?Do not drive experiencing excessive daytime sleepiness or fatigue ?Focus on side sleeping position or elevate head of bed 30 degrees your back ? ?Orders ?Sleep study re: snoring ? ?Follow-up ?Please call our office to schedule follow-up visit with Beth NP once you have completed home sleep study to review results and treatment options if needed ? ?Sleep Apnea ?Sleep apnea affects breathing during sleep. It causes breathing to stop for 10 seconds or more, or to become shallow. People with sleep apnea usually snore loudly. ?It can also increase the risk of: ?Heart attack. ?Stroke. ?Being very overweight (obese). ?Diabetes. ?Heart failure. ?Irregular heartbeat. ?High blood pressure. ?The goal of treatment is to help you breathe normally again. ?What are the causes? ? ?The most common cause of this condition is a collapsed or blocked airway. ?There are three kinds of sleep apnea: ?Obstructive sleep apnea. This is caused by a blocked or collapsed airway. ?Central sleep apnea. This happens when the brain does not send the right signals to the muscles that control breathing. ?Mixed sleep apnea. This is a combination of obstructive and central sleep apnea. ?What increases the risk? ?Being overweight. ?Smoking. ?Having a small airway. ?Being older. ?Being male. ?Drinking alcohol. ?Taking medicines to calm yourself (sedatives or tranquilizers). ?Having family members with the condition. ?Having a tongue or tonsils that are larger than normal. ?What are the signs or symptoms? ?Trouble staying asleep. ?Loud snoring. ?Headaches in the morning. ?Waking up gasping. ?Dry mouth or sore throat in the morning. ?Being sleepy or tired during the day. ?If you are sleepy or tired  during the day, you may also: ?Not be able to focus your mind (concentrate). ?Forget things. ?Get angry a lot and have mood swings. ?Feel sad (depressed). ?Have changes in your personality. ?Have less interest in sex, if you are male. ?Be unable to have an erection, if you are male. ?How is this treated? ? ?Sleeping on your side. ?Using a medicine to get rid of mucus in your nose (decongestant). ?Avoiding the use of alcohol, medicines to help you relax, or certain pain medicines (narcotics). ?Losing weight, if needed. ?Changing your diet. ?Quitting smoking. ?Using a machine to open your airway while you sleep, such as: ?An oral appliance. This is a mouthpiece that shifts your lower jaw forward. ?A CPAP device. This device blows air through a mask when you breathe out (exhale). ?An EPAP device. This has valves that you put in each nostril. ?A BIPAP device. This device blows air through a mask when you breathe in (inhale) and breathe out. ?Having surgery if other treatments do not work. ?Follow these instructions at home: ?Lifestyle ?Make changes that your doctor recommends. ?Eat a healthy diet. ?Lose weight if needed. ?Avoid alcohol, medicines to help you relax, and some pain medicines. ?Do not smoke or use any products that contain nicotine or tobacco. If you need help quitting, ask your doctor. ?General instructions ?Take over-the-counter and prescription medicines only as told by your doctor. ?If you were given a machine to use while you sleep, use it only as told by your doctor. ?If you are having surgery, make sure to tell your doctor you have sleep apnea. You may need to bring your device with you. ?Keep all follow-up visits. ?Contact a doctor if: ?The machine that  you were given to use during sleep bothers you or does not seem to be working. ?You do not get better. ?You get worse. ?Get help right away if: ?Your chest hurts. ?You have trouble breathing in enough air. ?You have an uncomfortable feeling in your  back, arms, or stomach. ?You have trouble talking. ?One side of your body feels weak. ?A part of your face is hanging down. ?These symptoms may be an emergency. Get help right away. Call your local emergency services (911 in the [place] ?Do not wait to see if the symptoms will go away. ?Do not drive yourself to the hospital. ?Summary ?This condition affects breathing during sleep. ?The most common cause is a collapsed or blocked airway. ?The goal of treatment is to help you breathe normally while you sleep. ?This information is not intended to replace advice given to you by your health care provider. Make sure you discuss any questions you have with your health care provider. ?Document Revised: 07/24/2021 Document Reviewed: 11/23/2020 ?Elsevier Patient Education ? [place]. ? ?  ?

## 2022-05-02 NOTE — Progress Notes (Signed)
? ?'@Patient'$  ID: Anthony Patton., male    DOB: 1971/01/04, 51 y.o.   MRN: [id number] ? ?Chief Complaint  ?Patient presents with  ? sleep consult  ?  No prior sleep study--c/o loud snoring, restless sleep and occ daytime sleepiness.   ? ? ?Referring provider: ?McLean-Scocuzza, Olivia Mackie * ? ?HPI: ?51 year old male, never smoked.  Past medical history significant for hypertension, allergic rhinitis, GERD, hyperlipidemia. ? ?05/02/2022 ?Patient presents today for sleep consult. He has symptoms of loud snoring, restless sleep and daytime sleepiness. He has been following with cardiology for hypertension. Maintained on Amlodipine 2.'5mg'$  daily and Losartan '100mg'$  daily. His HR runs in mid 50-60s. Typical bedtime time is 10pm. It does not take him long to fall asleep. He wakes up on average three times a night. He starts his day at 6:30am. No prior sleep study. He does not wear CPAP or oxygen. Epworth 12. Denies narcolepsy, cataplexy or sleep walking.  ? ?Sleep questionnaire ?Symptoms- Loud snoring, restless sleep, daytime sleepiness ?Prior sleep study- None ?Typical bedtime- 10pm ?Time to fall asleep- varies, usually quickly  ?Nocturnal awakenings- 3 times ?Start of day- 6:30 ?Weight changes in past 2 years- 5 lbs up  ?Do you operate heavy machinery- No ?Do currently wear CPAP- No ?Do currently wear oxygen-- No ?Epworth-12  ? ? ?Allergies  ?Allergen Reactions  ? Hctz [Hydrochlorothiazide]   ?  Dizziness  ?  ? Penicillins Rash  ?  Rash as chid but able to tolerate amoxicillin ?  ? ? ?Immunization History  ?Administered Date(s) Administered  ? Influenza,inj,Quad PF,6+ Mos 10/24/2019  ? Influenza-Unspecified 09/28/2020, 10/29/2021  ? Moderna Covid-19 Vaccine Bivalent Booster 80yr & up 10/29/2021  ? Moderna SARS-COV2 Booster Vaccination 11/14/2020  ? Moderna Sars-Covid-2 Vaccination 01/17/2020, 02/14/2020  ? ? ?Past Medical History:  ?Diagnosis Date  ? Allergy   ? Benign cyst of kidney   ? [facility] 04/11/22 4[facility]  ? Chicken pox   ? Chronic gastritis   ? egd [facility] gi Dr. LHaig Prophet1/5/23 with fundic gastric polyps  ? Colon polyps   ? Fundic gland polyps of stomach, benign   ? Gastritis   ? with esophagitis   ? GERD (gastroesophageal reflux disease)   ? Hiatal hernia   ? small noted egd 01/02/22 [facility] Dr. LHaig Prophet ? Hyperlipidemia   ? Hypertension   ? Liver cyst   ? 04/11/22 [facility] ab/pelvis  ? Low back pain   ? ? ?Tobacco History: ?Social History  ? ?Tobacco Use  ?Smoking Status Never  ?Smokeless Tobacco Never  ? ?Counseling given: Not Answered ? ? ?Outpatient Medications Prior to Visit  ?Medication Sig Dispense Refill  ? amLODipine (NORVASC) 2.5 MG tablet TAKE ONE TABLET BY MOUTH EVERY MORNING AND TAKE ONE TABLET BY MOUTH EVERY NIGHT AT BEDTIME (FOR BP>130/>80, TAKE AN ADDITIONAL TABLET) 180 tablet 3  ? Ascorbic Acid (VITAMIN C PO) Take by mouth.    ? atorvastatin (LIPITOR) 10 MG tablet Take 1 tablet (10 mg total) by mouth daily at 6 PM. 90 tablet 3  ? cyclobenzaprine (FLEXERIL) 5 MG tablet Take 1 tablet (5 mg total) by mouth at bedtime as needed for muscle spasms. 30 tablet 2  ? famotidine (PEPCID) 20 MG tablet Take 1 tablet (20 mg total) by mouth 2 (two) times daily. 30 tablet 0  ? fexofenadine (ALLEGRA) 180 MG tablet Take 180 mg by mouth daily as needed for allergies or rhinitis.    ? hydrOXYzine (ATARAX) 25 MG  tablet Take 1-2 tablets (25-50 mg total) by mouth at bedtime as needed. Or BID prn 180 tablet 3  ? losartan (COZAAR) 100 MG tablet TAKE ONE TABLET BY MOUTH DAILY 90 tablet 3  ? LYSINE PO Take by mouth.    ? Multiple Vitamin (MULTIVITAMIN) capsule Take 1 capsule by mouth daily.    ? pantoprazole (PROTONIX) 20 MG tablet TAKE ONE TABLET BY MOUTH DAILY 30 MINUTES BEFORE FOOD 90 tablet 3  ? sildenafil (REVATIO) 20 MG tablet Take 1-5 tablets (20-100 mg total) by mouth daily as needed. 30 tablet 11  ? ?No facility-administered medications prior to visit.  ? ? ? ? ?Review of Systems ? ?Review of Systems   ?Constitutional:  Positive for fatigue.  ?HENT: Negative.    ?Respiratory: Negative.    ?Psychiatric/Behavioral:  Positive for sleep disturbance.   ? ? ?Physical Exam ? ?BP 122/72 (BP Location: Left Arm, Cuff Size: Normal)   Pulse (!) 51   Temp 97.8 ?F (36.6 ?C) (Temporal)   Ht '5\' 6"'$  (1.676 m)   Wt 159 lb (72.1 kg)   SpO2 99%   BMI 25.66 kg/m?  ?Physical Exam ?Constitutional:   ?   Appearance: Normal appearance.  ?HENT:  ?   Head: Normocephalic and atraumatic.  ?Cardiovascular:  ?   Rate and Rhythm: Normal rate and regular rhythm.  ?Pulmonary:  ?   Effort: Pulmonary effort is normal.  ?   Breath sounds: Normal breath sounds.  ?Musculoskeletal:     ?   General: Normal range of motion.  ?Skin: ?   General: Skin is warm and dry.  ?Neurological:  ?   General: No focal deficit present.  ?   Mental Status: He is alert and oriented to person, place, and time. Mental status is at baseline.  ?  ? ?Lab Results: ? ?CBC ?   ?Component Value Date/Time  ? WBC 8.1 03/20/2022 0855  ? RBC 5.22 03/20/2022 0855  ? HGB 15.6 03/20/2022 0855  ? HGB 16.1 03/12/2021 0802  ? HCT 45.3 03/20/2022 0855  ? HCT 47.1 03/12/2021 0802  ? PLT 169.0 03/20/2022 0855  ? PLT 205 03/12/2021 0802  ? MCV 86.7 03/20/2022 0855  ? MCV 86 03/12/2021 0802  ? MCH 29.5 03/12/2021 0802  ? MCH 29.5 02/27/2020 1620  ? MCHC 34.4 03/20/2022 0855  ? RDW 13.1 03/20/2022 0855  ? RDW 13.3 03/12/2021 0802  ? LYMPHSABS 1.2 03/20/2022 0855  ? LYMPHSABS 1.3 03/12/2021 0802  ? MONOABS 0.4 03/20/2022 0855  ? EOSABS 0.2 03/20/2022 0855  ? EOSABS 0.3 03/12/2021 0802  ? BASOSABS 0.1 03/20/2022 0855  ? BASOSABS 0.1 03/12/2021 0802  ? ? ?BMET ?   ?Component Value Date/Time  ? NA 141 03/20/2022 0855  ? NA 145 (H) 03/12/2021 0802  ? K 4.7 03/20/2022 0855  ? CL 103 03/20/2022 0855  ? CO2 33 (H) 03/20/2022 0855  ? GLUCOSE 88 03/20/2022 0855  ? BUN 19 03/20/2022 0855  ? BUN 12 03/12/2021 0802  ? CREATININE 1.09 03/20/2022 0855  ? CALCIUM 9.6 03/20/2022 0855  ? GFRNONAA 73  06/12/2020 0724  ? GFRAA 85 06/12/2020 0724  ? ? ?BNP ?No results found for: BNP ? ?ProBNP ?No results found for: PROBNP ? ?Imaging: ?No results found. ? ? ?Assessment & Plan:  ? ?Loud snoring ?- Patient has symptoms of Loud snoring, restless sleep and daytime sleepiness.  Epworth 12.  Concern patient could have obstructive sleep apnea, needs home sleep study to evaluate. Discussed risk  of untreated sleep apnea including cardiac arrhythmias, pulm HTN, stroke, DM. We briefly reviewed treatment options. Encouraged patient to work on weight loss efforts and focus on side sleeping position/elevate head of bed. Advised against driving if experiencing excessive daytime sleepiness. Follow-up in 4-6 weeks to review sleep study results and discuss treatment options further. ? ? ? ?Martyn Ehrich, NP ?05/05/2022 ? ?

## 2022-05-05 DIAGNOSIS — R0683 Snoring: Secondary | ICD-10-CM | POA: Insufficient documentation

## 2022-05-05 NOTE — Assessment & Plan Note (Signed)
-   Patient has symptoms of Loud snoring, restless sleep and daytime sleepiness.  Epworth 12.  Concern patient could have obstructive sleep apnea, needs home sleep study to evaluate. Discussed risk of untreated sleep apnea including cardiac arrhythmias, pulm HTN, stroke, DM. We briefly reviewed treatment options. Encouraged patient to work on weight loss efforts and focus on side sleeping position/elevate head of bed. Advised against driving if experiencing excessive daytime sleepiness. Follow-up in 4-6 weeks to review sleep study results and discuss treatment options further. ? ? ?

## 2022-06-10 NOTE — Telephone Encounter (Signed)
I have spoke with the patient and he has worked it out with his scheduled to still pick up the HST machine in the morning 06/10/22 @ 8:30am

## 2022-06-10 NOTE — Telephone Encounter (Signed)
Anita, please advise. Thanks 

## 2022-06-11 ENCOUNTER — Ambulatory Visit: Payer: BC Managed Care – PPO

## 2022-06-11 DIAGNOSIS — G4733 Obstructive sleep apnea (adult) (pediatric): Secondary | ICD-10-CM

## 2022-06-11 DIAGNOSIS — R0683 Snoring: Secondary | ICD-10-CM

## 2022-06-12 DIAGNOSIS — G4733 Obstructive sleep apnea (adult) (pediatric): Secondary | ICD-10-CM | POA: Diagnosis not present

## 2022-06-20 ENCOUNTER — Telehealth: Payer: Self-pay | Admitting: Primary Care

## 2022-06-26 NOTE — Telephone Encounter (Signed)
Called and spoke with pt letting him know the results of HST and he verbalized understanding. Appt has been scheduled for pt with BW. Nothing further needed.

## 2022-07-07 ENCOUNTER — Encounter: Payer: Self-pay | Admitting: Internal Medicine

## 2022-07-07 ENCOUNTER — Other Ambulatory Visit: Payer: Self-pay | Admitting: Internal Medicine

## 2022-07-07 DIAGNOSIS — J309 Allergic rhinitis, unspecified: Secondary | ICD-10-CM

## 2022-07-07 MED ORDER — LEVOCETIRIZINE DIHYDROCHLORIDE 5 MG PO TABS: 5.0000 mg | ORAL_TABLET | Freq: Every evening | ORAL | 3 refills | Status: DC | PRN

## 2022-07-14 ENCOUNTER — Encounter: Payer: BC Managed Care – PPO | Admitting: Primary Care

## 2022-07-14 NOTE — Patient Instructions (Signed)
Home sleep study showed evidence of moderate obstructive sleep apnea, on average you had 20 apneic/hypopneic events an hour  Due to severity of your symptoms and OSA recommend starting you on CPAP. Other options include oral appliance or referral to ENT for possible surgical options   Recommendations Aim to wear CPAP every night for minimum 4 to 6 hours or longer Maintain normal BMI range Focus on side sleeping position Do not drive if experiencing excessive daytime sleepiness fatigue  Orders New CPAP start 5 to 15 cm H2O with mask of choice  Follow-up 2 to 3 months for compliance check

## 2022-07-14 NOTE — Progress Notes (Signed)
 This encounter was created in error - please disregard.

## 2022-07-18 ENCOUNTER — Ambulatory Visit: Payer: BC Managed Care – PPO | Admitting: Adult Health

## 2022-07-21 ENCOUNTER — Encounter: Payer: BC Managed Care – PPO | Admitting: Primary Care

## 2022-07-21 DIAGNOSIS — G4730 Sleep apnea, unspecified: Secondary | ICD-10-CM | POA: Insufficient documentation

## 2022-07-21 NOTE — Progress Notes (Signed)
 This encounter was created in error - please disregard.

## 2022-07-21 NOTE — Assessment & Plan Note (Signed)
-   Patient has symptoms of loud snoring, restless sleep and daytime sleepiness.  Home sleep study on 06/11/2022 showed moderate obstructive sleep apnea, AHI 20.7 an hour.

## 2022-07-21 NOTE — Patient Instructions (Signed)
Sleep study was positive for moderate obstructive sleep apnea, you had on average 20 apneic/hypopneic events an hour  Treatment recommend is CPAP. Other options are oral appliance or referral ENT for possible surgical options   Recommendations: Aim to wear CPAP every night for min 4-6 hours or longer Do not drive experiencing excessive daytime sleepiness or fatigue Maintain normal BMI range  Follow-up: 3 months for CPAP compliance check

## 2022-08-04 DIAGNOSIS — D485 Neoplasm of uncertain behavior of skin: Secondary | ICD-10-CM | POA: Diagnosis not present

## 2022-08-04 DIAGNOSIS — D2261 Melanocytic nevi of right upper limb, including shoulder: Secondary | ICD-10-CM | POA: Diagnosis not present

## 2022-08-05 ENCOUNTER — Encounter: Payer: Self-pay | Admitting: Acute Care

## 2022-08-05 ENCOUNTER — Ambulatory Visit (INDEPENDENT_AMBULATORY_CARE_PROVIDER_SITE_OTHER): Payer: BC Managed Care – PPO | Admitting: Acute Care

## 2022-08-05 VITALS — BP 122/74 | HR 52 | Temp 98.3°F | Ht 66.0 in | Wt 162.0 lb

## 2022-08-05 DIAGNOSIS — G4733 Obstructive sleep apnea (adult) (pediatric): Secondary | ICD-10-CM

## 2022-08-05 DIAGNOSIS — I10 Essential (primary) hypertension: Secondary | ICD-10-CM | POA: Diagnosis not present

## 2022-08-05 NOTE — Progress Notes (Signed)
History of Present Illness Anthony Patton. is a 51 y.o. male never smoker with Past medical history significant for hypertension, allergic rhinitis, GERD, hyperlipidemia.He was seen last 05/02/22 by Derl Barrow NP.  Previous [facility] pulmonary encounter: 05/02/2022 Patient presents today for sleep consult. He has symptoms of loud snoring, restless sleep and daytime sleepiness. He has been following with cardiology for hypertension. Maintained on Amlodipine 2.'5mg'$  daily and Losartan '100mg'$  daily. His HR runs in mid 50-60s. Typical bedtime time is 10pm. It does not take him long to fall asleep. He wakes up on average three times a night. He starts his day at 6:30am. No prior sleep study. He does not wear CPAP or oxygen. Epworth 12. Denies narcolepsy, cataplexy or sleep walking.    Sleep questionnaire Symptoms- Loud snoring, restless sleep, daytime sleepiness Prior sleep study- None Typical bedtime- 10pm Time to fall asleep- varies, usually quickly  Nocturnal awakenings- 3 times Start of day- 6:30 Weight changes in past 2 years- 5 lbs up  Do you operate heavy machinery- No Do currently wear CPAP- No Do currently wear oxygen-- No Epworth-12   Body mass index is 26.15 kg/m.    08/05/2022 Pt. Presents for follow up to review sleep study results. Patient was seen back in May for sleep consult.  He has symptoms of loud snoring, restless sleep and daytime sleepiness.  Epworth score 12.  Patient had a home sleep study on 06/11/2022 that showed evidence of moderate obstructive sleep apnea, AHI 20.7/hour with SPO2 low 85%. 91 events were obstructive, 3 central and 13 mixed. He slept for 6.3 hours and had 72 desaturations with a nadir of 85%.  I have discussed the options of Inspire ( His BMI is 26) oral appliance and CPAP therapy with the patient. We discussed the benefits of each. He would like to start  CPAP therapy, starting with nasal pillows and then increasing mask size if his AHI is not well  controlled. He is in agreement with this plan. He endorses 4 nighttime awakenings and he does have HTN at baseline.  Test Results: Home sleep study on 06/11/2022 showed moderate obstructive sleep apnea, AHI 20.7 an hour 91 events were obstructive, 3 central and 13 mixed. He slept for 6.3 hours and had 72 desaturations with a nadir of 85%    Latest Ref Rng & Units 03/20/2022    8:55 AM 10/11/2021   11:04 AM 03/12/2021    8:02 AM  CBC  WBC 4.0 - 10.5 K/uL 8.1  7.8  5.9   Hemoglobin 13.0 - 17.0 g/dL 15.6  15.7  16.1   Hematocrit 39.0 - 52.0 % 45.3  46.5  47.1   Platelets 150.0 - 400.0 K/uL 169.0  198.0  205        Latest Ref Rng & Units 03/20/2022    8:55 AM 10/11/2021   11:04 AM 03/19/2021    9:18 AM  BMP  Glucose 70 - 99 mg/dL 88  94  89   BUN 6 - 23 mg/dL '19  12  14   '$ Creatinine 0.40 - 1.50 mg/dL 1.09  1.12  1.16   Sodium 135 - 145 mEq/L 141  139  139   Potassium 3.5 - 5.1 mEq/L 4.7  5.3 No hemolysis seen  5.1   Chloride 96 - 112 mEq/L 103  101  104   CO2 19 - 32 mEq/L 33  31  28   Calcium 8.4 - 10.5 mg/dL 9.6  10.2  9.4  BNP No results found for: "BNP"  ProBNP No results found for: "PROBNP"  PFT No results found for: "FEV1PRE", "FEV1POST", "FVCPRE", "FVCPOST", "TLC", "DLCOUNC", "PREFEV1FVCRT", "PSTFEV1FVCRT"  No results found.   Past medical hx Past Medical History:  Diagnosis Date   Allergy    Benign cyst of kidney    [facility] 04/11/22 4.8 cm [facility]   Chicken pox    Chronic gastritis    egd [facility] gi Dr. Haig Prophet 01/02/22 with fundic gastric polyps   Colon polyps    Fundic gland polyps of stomach, benign    Gastritis    with esophagitis    GERD (gastroesophageal reflux disease)    Hiatal hernia    small noted egd 01/02/22 [facility] Dr. Haig Prophet   Hyperlipidemia    Hypertension    Liver cyst    04/11/22 [facility] ab/pelvis   Low back pain      Social History   Tobacco Use   Smoking status: Never   Smokeless tobacco: Never  Substance Use Topics    Alcohol use: Yes   Drug use: Not Currently    Mr.Niswander reports that he has never smoked. He has never used smokeless tobacco. He reports current alcohol use. He reports that he does not currently use drugs.  Tobacco Cessation: Never smoker   Past surgical hx, Family hx, Social hx all reviewed.  Current Outpatient Medications on File Prior to Visit  Medication Sig   amLODipine (NORVASC) 2.5 MG tablet TAKE ONE TABLET BY MOUTH EVERY MORNING AND TAKE ONE TABLET BY MOUTH EVERY NIGHT AT BEDTIME (FOR BP>130/>80, TAKE AN ADDITIONAL TABLET)   Ascorbic Acid (VITAMIN C PO) Take by mouth.   atorvastatin (LIPITOR) 10 MG tablet Take 1 tablet (10 mg total) by mouth daily at 6 PM.   cyclobenzaprine (FLEXERIL) 5 MG tablet Take 1 tablet (5 mg total) by mouth at bedtime as needed for muscle spasms.   famotidine (PEPCID) 20 MG tablet Take 1 tablet (20 mg total) by mouth 2 (two) times daily.   hydrOXYzine (ATARAX) 25 MG tablet Take 1-2 tablets (25-50 mg total) by mouth at bedtime as needed. Or BID prn   levocetirizine (XYZAL ALLERGY 24HR) 5 MG tablet Take 1 tablet (5 mg total) by mouth at bedtime as needed for allergies.   losartan (COZAAR) 100 MG tablet TAKE ONE TABLET BY MOUTH DAILY   LYSINE PO Take by mouth.   Multiple Vitamin (MULTIVITAMIN) capsule Take 1 capsule by mouth daily.   pantoprazole (PROTONIX) 20 MG tablet TAKE ONE TABLET BY MOUTH DAILY 30 MINUTES BEFORE FOOD   sildenafil (REVATIO) 20 MG tablet Take 1-5 tablets (20-100 mg total) by mouth daily as needed.   No current facility-administered medications on file prior to visit.     Allergies  Allergen Reactions   Hctz [Hydrochlorothiazide]     Dizziness     Penicillins Rash    Rash as chid but able to tolerate amoxicillin     Review Of Systems:  Constitutional:   No  weight loss, night sweats,  Fevers, chills, fatigue, or  lassitude.  HEENT:   No headaches,  Difficulty swallowing,  Tooth/dental problems, or  Sore throat,                 No sneezing, itching, ear ache, nasal congestion, post nasal drip,   CV:  No chest pain,  Orthopnea, PND, swelling in lower extremities, anasarca, dizziness, palpitations, syncope.   GI  No heartburn, indigestion, abdominal pain, nausea, vomiting,  diarrhea, change in bowel habits, loss of appetite, bloody stools.   Resp: No shortness of breath with exertion or at rest.  No excess mucus, no productive cough,  No non-productive cough,  No coughing up of blood.  No change in color of mucus.  No wheezing.  No chest wall deformity  Skin: no rash or lesions.  GU: no dysuria, change in color of urine, no urgency or frequency.  No flank pain, no hematuria   MS:  No joint pain or swelling.  No decreased range of motion.  No back pain.  Psych:  No change in mood or affect. No depression or anxiety.  No memory loss.   Vital Signs BP 122/74 (BP Location: Left Arm, Patient Position: Sitting, Cuff Size: Normal)   Pulse (!) 52   Temp 98.3 F (36.8 C) (Oral)   Ht '5\' 6"'$  (1.676 m)   Wt 162 lb (73.5 kg)   SpO2 98%   BMI 26.15 kg/m    Physical Exam: General- No distress,  A&O x 3, pleasant ENT: No sinus tenderness, TM clear, pale nasal mucosa, no oral exudate,no post nasal drip, no LAN Cardiac: S1, S2, regular rate and rhythm, no murmur Chest: No wheeze/ rales/ dullness; no accessory muscle use, no nasal flaring, no sternal retractions Abd.: Soft Non-tender, ND, BS +, Body mass index is 26.15 kg/m.  Ext: No clubbing cyanosis, edema Neuro:  normal strength, MAE x 4, A&O x 3, appropriate Skin: No rashes, warm and dry, no lesions  Psych: normal mood and behavior   Assessment/Plan Moderate OSA Plan We will place orders for CPAP machine and all supplies. Autoset 5-15 cm H2O Mask of choice is Nasal Pillows >> [employer].  We will enroll you in [facility] to monitor therapy Continue on CPAP at bedtime. You appear to be benefiting from the treatment  Goal is to wear for at least 6 hours  each night for maximal clinical benefit.  Remember to establish a good bedtime routine, and work on sleep hygiene.  Limit daytime naps , avoid stimulants such as caffeine and nicotine close to bedtime, exercise daily to promote sleep quality, avoid heavy , spicy, fried , or rich foods before bed. Ensure adequate exposure to natural light during the day,establish a relaxing bedtime routine with a pleasant sleep environment ( Bedroom between 60 and 67 degrees, turn off bright lights , TV or device screens screens , consider black out curtains or white noise machines) Do not drive if sleepy. Remember to clean mask, tubing, filter, and reservoir once weekly with soapy water.  Follow up with  Judson Roch NP   In 9 weeks  or before as needed for down Load.       I spent 40 minutes dedicated to the care of this patient on the date of this encounter to include pre-visit review of records, face-to-face time with the patient discussing conditions above, post visit ordering of testing, clinical documentation with the electronic health record, making appropriate referrals as documented, and communicating necessary information to the patient's healthcare team.   Magdalen Spatz, NP 08/05/2022  4:39 PM

## 2022-08-05 NOTE — Progress Notes (Signed)
Reviewed and agree with assessment/plan.   Chesley Mires, MD [facility] Pulmonary/Critical Care 08/05/2022, 4:59 PM Pager:  [phone]

## 2022-08-05 NOTE — Patient Instructions (Addendum)
It is good to see you today. We will place orders for CPAP machine and all supplies. Autoset 5-15 cm H2O Mask of choice is Nasal Pillows >> [employer].  We will enroll you in Air View to monitor therapy Continue on CPAP at bedtime. You appear to be benefiting from the treatment  Goal is to wear for at least 6 hours each night for maximal clinical benefit.  Remember to establish a good bedtime routine, and work on sleep hygiene.  Limit daytime naps , avoid stimulants such as caffeine and nicotine close to bedtime, exercise daily to promote sleep quality, avoid heavy , spicy, fried , or rich foods before bed. Ensure adequate exposure to natural light during the day,establish a relaxing bedtime routine with a pleasant sleep environment ( Bedroom between 60 and 67 degrees, turn off bright lights , TV or device screens screens , consider black out curtains or white noise machines) Do not drive if sleepy. Remember to clean mask, tubing, filter, and reservoir once weekly with soapy water.  Follow up with  Judson Roch NP   In 9 weeks  or before as needed for down Load.

## 2022-08-15 ENCOUNTER — Encounter: Payer: Self-pay | Admitting: Family Medicine

## 2022-08-15 ENCOUNTER — Ambulatory Visit: Payer: BC Managed Care – PPO | Admitting: Family Medicine

## 2022-08-15 ENCOUNTER — Ambulatory Visit (INDEPENDENT_AMBULATORY_CARE_PROVIDER_SITE_OTHER): Payer: BC Managed Care – PPO | Admitting: Family Medicine

## 2022-08-15 VITALS — BP 120/80 | HR 84 | Temp 98.8°F | Ht 67.4 in | Wt 159.0 lb

## 2022-08-15 DIAGNOSIS — H65191 Other acute nonsuppurative otitis media, right ear: Secondary | ICD-10-CM | POA: Diagnosis not present

## 2022-08-15 DIAGNOSIS — R051 Acute cough: Secondary | ICD-10-CM | POA: Diagnosis not present

## 2022-08-15 LAB — POC COVID19 BINAXNOW: SARS Coronavirus 2 Ag: POSITIVE — AB

## 2022-08-15 MED ORDER — AMOXICILLIN 500 MG PO CAPS: 1000.0000 mg | ORAL_CAPSULE | Freq: Two times a day (BID) | ORAL | 0 refills | Status: DC

## 2022-08-15 MED ORDER — NIRMATRELVIR/RITONAVIR (PAXLOVID)TABLET: 3.0000 | ORAL_TABLET | Freq: Two times a day (BID) | ORAL | 0 refills | Status: AC

## 2022-08-15 NOTE — Patient Instructions (Addendum)
Start Flonase 2 sprays per nostril daily.  Can use Mucinex DM.  Avoid decongestant given high blood pressure history. Start Paxlovid as directed.  Hold sildenafil while on.   If ear pain is not improving in 48 to 72 hours start amoxicillin for possible right bacterial ear infection.

## 2022-08-15 NOTE — Progress Notes (Unsigned)
Patient ID: Anthony Oka., male    DOB: November 30, 1971, 51 y.o.   MRN: [id number]  This visit was conducted in person.  BP 120/80 (BP Location: Left Arm, Patient Position: Sitting)   Pulse 84   Temp 98.8 F (37.1 C) (Oral)   Ht 5' 7.4" (1.712 m)   Wt 159 lb (72.1 kg)   SpO2 97%   BMI 24.61 kg/m    CC:  Chief Complaint  Patient presents with   Ear Pain    Started with a headache, has sore throat and congestion    Subjective:   HPI: Anthony Patton. is a 51 y.o. male  allergies and HTN presenting on 08/15/2022 for Ear Pain (Started with a headache, has sore throat and congestion)  Date of  onset: 2 days ago  Started with headache, had congestion.  Took a flight on airplane.. had severe bilateral ear pain.. R greater than left  Last night started with severe ST.  Has bodyache. Low grade fever, chills   No recent antibiotics.  Mild cough, no production, more dry.  NO SOB, no wheeze.  No known exposure to COVID  1 week ago went to concert.     Relevant past medical, surgical, family and social history reviewed and updated as indicated. Interim medical history since our last visit reviewed. Allergies and medications reviewed and updated. Outpatient Medications Prior to Visit  Medication Sig Dispense Refill   amLODipine (NORVASC) 2.5 MG tablet TAKE ONE TABLET BY MOUTH EVERY MORNING AND TAKE ONE TABLET BY MOUTH EVERY NIGHT AT BEDTIME (FOR BP>130/>80, TAKE AN ADDITIONAL TABLET) 180 tablet 3   Ascorbic Acid (VITAMIN C PO) Take by mouth.     atorvastatin (LIPITOR) 10 MG tablet Take 1 tablet (10 mg total) by mouth daily at 6 PM. 90 tablet 3   cyclobenzaprine (FLEXERIL) 5 MG tablet Take 1 tablet (5 mg total) by mouth at bedtime as needed for muscle spasms. 30 tablet 2   famotidine (PEPCID) 20 MG tablet Take 1 tablet (20 mg total) by mouth 2 (two) times daily. 30 tablet 0   hydrOXYzine (ATARAX) 25 MG tablet Take 1-2 tablets (25-50 mg total) by mouth at bedtime as  needed. Or BID prn 180 tablet 3   levocetirizine (XYZAL ALLERGY 24HR) 5 MG tablet Take 1 tablet (5 mg total) by mouth at bedtime as needed for allergies. 90 tablet 3   losartan (COZAAR) 100 MG tablet TAKE ONE TABLET BY MOUTH DAILY 90 tablet 3   LYSINE PO Take by mouth.     Multiple Vitamin (MULTIVITAMIN) capsule Take 1 capsule by mouth daily.     pantoprazole (PROTONIX) 20 MG tablet TAKE ONE TABLET BY MOUTH DAILY 30 MINUTES BEFORE FOOD 90 tablet 3   sildenafil (REVATIO) 20 MG tablet Take 1-5 tablets (20-100 mg total) by mouth daily as needed. 30 tablet 11   No facility-administered medications prior to visit.     Per HPI unless specifically indicated in ROS section below Review of Systems  Constitutional:  Negative for fatigue and fever.  HENT:  Negative for ear pain.   Eyes:  Negative for pain.  Respiratory:  Negative for cough and shortness of breath.   Cardiovascular:  Negative for chest pain, palpitations and leg swelling.  Gastrointestinal:  Negative for abdominal pain.  Genitourinary:  Negative for dysuria.  Musculoskeletal:  Negative for arthralgias.  Neurological:  Negative for syncope, light-headedness and headaches.  Psychiatric/Behavioral:  Negative for dysphoric mood.    Objective:  BP 120/80 (BP Location: Left Arm, Patient Position: Sitting)   Pulse 84   Temp 98.8 F (37.1 C) (Oral)   Ht 5' 7.4" (1.712 m)   Wt 159 lb (72.1 kg)   SpO2 97%   BMI 24.61 kg/m   Wt Readings from Last 3 Encounters:  08/15/22 159 lb (72.1 kg)  08/05/22 162 lb (73.5 kg)  05/02/22 159 lb (72.1 kg)      Physical Exam Constitutional:      Appearance: He is well-developed.  HENT:     Head: Normocephalic.     Right Ear: Hearing normal. A middle ear effusion is present. Tympanic membrane is injected.     Left Ear: Hearing normal.     Nose: Nose normal.  Neck:     Thyroid: No thyroid mass or thyromegaly.     Vascular: No carotid bruit.     Trachea: Trachea normal.  Cardiovascular:      Rate and Rhythm: Normal rate and regular rhythm.     Pulses: Normal pulses.     Heart sounds: Heart sounds not distant. No murmur heard.    No friction rub. No gallop.     Comments: No peripheral edema Pulmonary:     Effort: Pulmonary effort is normal. No respiratory distress.     Breath sounds: Normal breath sounds.  Skin:    General: Skin is warm and dry.     Findings: No rash.  Psychiatric:        Speech: Speech normal.        Behavior: Behavior normal.        Thought Content: Thought content normal.       Results for orders placed or performed in visit on 03/20/22  Comprehensive metabolic panel  Result Value Ref Range   Sodium 141 135 - 145 mEq/L   Potassium 4.7 3.5 - 5.1 mEq/L   Chloride 103 96 - 112 mEq/L   CO2 33 (H) 19 - 32 mEq/L   Glucose, Bld 88 70 - 99 mg/dL   BUN 19 6 - 23 mg/dL   Creatinine, Ser 1.09 0.40 - 1.50 mg/dL   Total Bilirubin 0.8 0.2 - 1.2 mg/dL   Alkaline Phosphatase 93 39 - 117 U/L   AST 26 0 - 37 U/L   ALT 34 0 - 53 U/L   Total Protein 6.8 6.0 - 8.3 g/dL   Albumin 4.8 3.5 - 5.2 g/dL   GFR 77.71 >60.00 mL/min   Calcium 9.6 8.4 - 10.5 mg/dL  Lipid panel  Result Value Ref Range   Cholesterol 142 0 - 200 mg/dL   Triglycerides 77.0 0.0 - 149.0 mg/dL   HDL 50.40 >39.00 mg/dL   VLDL 15.4 0.0 - 40.0 mg/dL   LDL Cholesterol 76 0 - 99 mg/dL   Total CHOL/HDL Ratio 3    NonHDL 91.30   CBC with Differential/Platelet  Result Value Ref Range   WBC 8.1 4.0 - 10.5 K/uL   RBC 5.22 4.22 - 5.81 Mil/uL   Hemoglobin 15.6 13.0 - 17.0 g/dL   HCT 45.3 39.0 - 52.0 %   MCV 86.7 78.0 - 100.0 fl   MCHC 34.4 30.0 - 36.0 g/dL   RDW 13.1 11.5 - 15.5 %   Platelets 169.0 150.0 - 400.0 K/uL   Neutrophils Relative % 77.3 (H) 43.0 - 77.0 %   Lymphocytes Relative 14.5 12.0 - 46.0 %   Monocytes Relative 5.0 3.0 - 12.0 %   Eosinophils Relative 2.3 0.0 - 5.0 %  Basophils Relative 0.9 0.0 - 3.0 %   Neutro Abs 6.3 1.4 - 7.7 K/uL   Lymphs Abs 1.2 0.7 - 4.0 K/uL    Monocytes Absolute 0.4 0.1 - 1.0 K/uL   Eosinophils Absolute 0.2 0.0 - 0.7 K/uL   Basophils Absolute 0.1 0.0 - 0.1 K/uL  TSH  Result Value Ref Range   TSH 0.84 0.35 - 5.50 uIU/mL  Urinalysis, Routine w reflex microscopic  Result Value Ref Range   Color, Urine YELLOW YELLOW   APPearance CLEAR CLEAR   Specific Gravity, Urine 1.020 1.001 - 1.035   pH 7.5 5.0 - 8.0   Glucose, UA NEGATIVE NEGATIVE   Bilirubin Urine NEGATIVE NEGATIVE   Ketones, ur NEGATIVE NEGATIVE   Hgb urine dipstick NEGATIVE NEGATIVE   Protein, ur NEGATIVE NEGATIVE   Nitrite NEGATIVE NEGATIVE   Leukocytes,Ua NEGATIVE NEGATIVE  PSA  Result Value Ref Range   PSA 0.75 0.10 - 4.00 ng/mL  Testosterone  Result Value Ref Range   Testosterone 331.87 300.00 - 890.00 ng/dL  IBC + Ferritin  Result Value Ref Range   Iron 86 42 - 165 ug/dL   Transferrin 234.0 212.0 - 360.0 mg/dL   Saturation Ratios 26.3 20.0 - 50.0 %   Ferritin 86.9 22.0 - 322.0 ng/mL   TIBC 327.6 250.0 - 450.0 mcg/dL  Vitamin B12  Result Value Ref Range   Vitamin B-12 591 211 - 911 pg/mL  Vitamin D (25 hydroxy)  Result Value Ref Range   VITD 42.23 30.00 - 100.00 ng/mL  Magnesium  Result Value Ref Range   Magnesium 2.2 1.5 - 2.5 mg/dL     COVID 19 screen:  No recent travel or known exposure to COVID19 The patient denies respiratory symptoms of COVID 19 at this time. The importance of social distancing was discussed today.   Assessment and Plan    Problem List Items Addressed This Visit   None Visit Diagnoses     Acute cough    -  Primary   Relevant Orders   POC COVID-19 BinaxNow (Completed)   Acute otitis media with effusion of right ear       Relevant Medications   amoxicillin (AMOXIL) 500 MG capsule      Positive COVID test with bacterial super-infeciton of right ear.  Treat with antiviral as well as oral antibiotics Meds ordered this encounter  Medications   amoxicillin (AMOXIL) 500 MG capsule    Sig: Take 2 capsules (1,000 mg  total) by mouth 2 (two) times daily.    Dispense:  40 capsule    Refill:  0   nirmatrelvir/ritonavir EUA (PAXLOVID) 20 x 150 MG & 10 x '100MG'$  TABS    Sig: Take 3 tablets by mouth 2 (two) times daily for 5 days. Patient GFR is 77. Take nirmatrelvir (150 mg) 3 tablet(s) twice daily for 5 days and ritonavir (100 mg) one tablet twice daily for 5 days.    Dispense:  30 tablet    Refill:  0   Orders Placed This Encounter  Procedures   POC COVID-19 BinaxNow    Order Specific Question:   Previously tested for COVID-19    Answer:   No    Order Specific Question:   Resident in a congregate (group) care setting    Answer:   No    Order Specific Question:   Employed in healthcare setting    Answer:   No     Eliezer Lofts, MD

## 2022-08-25 ENCOUNTER — Other Ambulatory Visit: Payer: Self-pay | Admitting: Internal Medicine

## 2022-08-25 ENCOUNTER — Encounter: Payer: Self-pay | Admitting: Internal Medicine

## 2022-08-25 DIAGNOSIS — N529 Male erectile dysfunction, unspecified: Secondary | ICD-10-CM

## 2022-08-25 MED ORDER — SILDENAFIL CITRATE 20 MG PO TABS: 20.0000 mg | ORAL_TABLET | Freq: Every day | ORAL | 11 refills | Status: DC | PRN

## 2022-09-15 DIAGNOSIS — E782 Mixed hyperlipidemia: Secondary | ICD-10-CM | POA: Diagnosis not present

## 2022-09-15 DIAGNOSIS — I10 Essential (primary) hypertension: Secondary | ICD-10-CM | POA: Diagnosis not present

## 2022-10-14 ENCOUNTER — Ambulatory Visit: Payer: BC Managed Care – PPO | Admitting: Acute Care

## 2022-10-17 DIAGNOSIS — G4733 Obstructive sleep apnea (adult) (pediatric): Secondary | ICD-10-CM | POA: Diagnosis not present

## 2022-11-14 ENCOUNTER — Telehealth: Payer: Self-pay | Admitting: Acute Care

## 2022-11-14 NOTE — Telephone Encounter (Signed)
Called pt today 11/14/22. Pt returned cpap machine to [facility]. Pt states the cpap machine wasn't for him. He stated he felt, it was to much pressure coming out. He was having difficulties keeping his mouth closed. He felt like he still wasn't getting any rest. Pt also mentioned transportation being to much. He would've preferred a local [place]. Pt never expressed any concerns to his provider Eric Form NP. Pt did state that he has tried different mask pieces and he just couldn't adjust.pt has requested that is appt for next week be canceled.  I have expressed pt concerns with Judson Roch today. She is respecting the pt wishes. She has advised the pt to follow up as needed or if pt changes his mind

## 2022-11-18 ENCOUNTER — Ambulatory Visit: Payer: BC Managed Care – PPO | Admitting: Acute Care

## 2022-12-15 DIAGNOSIS — D2262 Melanocytic nevi of left upper limb, including shoulder: Secondary | ICD-10-CM | POA: Diagnosis not present

## 2022-12-15 DIAGNOSIS — L570 Actinic keratosis: Secondary | ICD-10-CM | POA: Diagnosis not present

## 2022-12-15 DIAGNOSIS — D2261 Melanocytic nevi of right upper limb, including shoulder: Secondary | ICD-10-CM | POA: Diagnosis not present

## 2022-12-15 DIAGNOSIS — D2272 Melanocytic nevi of left lower limb, including hip: Secondary | ICD-10-CM | POA: Diagnosis not present

## 2022-12-15 DIAGNOSIS — D225 Melanocytic nevi of trunk: Secondary | ICD-10-CM | POA: Diagnosis not present

## 2022-12-15 DIAGNOSIS — X32XXXA Exposure to sunlight, initial encounter: Secondary | ICD-10-CM | POA: Diagnosis not present

## 2023-03-24 ENCOUNTER — Encounter: Payer: BC Managed Care – PPO | Admitting: Internal Medicine

## 2023-05-31 ENCOUNTER — Encounter: Payer: Self-pay | Admitting: Family Medicine

## 2023-05-31 DIAGNOSIS — E785 Hyperlipidemia, unspecified: Secondary | ICD-10-CM

## 2023-06-01 ENCOUNTER — Encounter: Payer: Self-pay | Admitting: Family Medicine

## 2023-06-01 ENCOUNTER — Ambulatory Visit (INDEPENDENT_AMBULATORY_CARE_PROVIDER_SITE_OTHER): Payer: Managed Care, Other (non HMO) | Admitting: Family Medicine

## 2023-06-01 VITALS — BP 140/86 | HR 65 | Temp 98.3°F | Ht 67.4 in | Wt 157.8 lb

## 2023-06-01 DIAGNOSIS — E785 Hyperlipidemia, unspecified: Secondary | ICD-10-CM | POA: Diagnosis not present

## 2023-06-01 DIAGNOSIS — K2950 Unspecified chronic gastritis without bleeding: Secondary | ICD-10-CM

## 2023-06-01 DIAGNOSIS — Z114 Encounter for screening for human immunodeficiency virus [HIV]: Secondary | ICD-10-CM

## 2023-06-01 DIAGNOSIS — F39 Unspecified mood [affective] disorder: Secondary | ICD-10-CM

## 2023-06-01 DIAGNOSIS — E559 Vitamin D deficiency, unspecified: Secondary | ICD-10-CM | POA: Diagnosis not present

## 2023-06-01 DIAGNOSIS — E538 Deficiency of other specified B group vitamins: Secondary | ICD-10-CM

## 2023-06-01 DIAGNOSIS — I10 Essential (primary) hypertension: Secondary | ICD-10-CM | POA: Diagnosis not present

## 2023-06-01 DIAGNOSIS — Z125 Encounter for screening for malignant neoplasm of prostate: Secondary | ICD-10-CM

## 2023-06-01 DIAGNOSIS — E611 Iron deficiency: Secondary | ICD-10-CM

## 2023-06-01 DIAGNOSIS — K2100 Gastro-esophageal reflux disease with esophagitis, without bleeding: Secondary | ICD-10-CM

## 2023-06-01 DIAGNOSIS — Z1159 Encounter for screening for other viral diseases: Secondary | ICD-10-CM

## 2023-06-01 DIAGNOSIS — K449 Diaphragmatic hernia without obstruction or gangrene: Secondary | ICD-10-CM

## 2023-06-01 NOTE — Progress Notes (Signed)
SUBJECTIVE:   Chief Complaint  Patient presents with   Transitions Of Care   HPI Patient presents clinic to transfer care.  No acute concerns today.  Hypertension Asymptomatic.  Reports blood pressures have been 160s-180s in past.  Compliant with Cozaar 100 mg daily.  Was previously prescribed amlodipine 2.5 mg in addition to ARB.  Endorses when initially started amlodipine with Cozaar in am and would feel lightheaded in the evening. Has not been taking norvasc.  Hyperlipidemia Requesting refill for Lipitor 10 mg daily.  Denies any myalgias.  Mood disorder Takes Atarax 25 mg nightly primarily for anxiety.  Previously tried Wellbutrin but discontinued.    GERD Takes Protonix 20 mg and well-controlled.   PERTINENT PMH / PSH: Chronic mild gastritis Diverticulosis Mood disorder Hypertension Hyperlipidemia GERD  OBJECTIVE:  BP (!) 140/86   Pulse 65   Temp 98.3 F (36.8 C)   Ht 5' 7.4" (1.712 m)   Wt 157 lb 12.8 oz (71.6 kg)   SpO2 96%   BMI 24.42 kg/m    Physical Exam Constitutional:      General: He is not in acute distress.    Appearance: He is normal weight. He is not ill-appearing.  HENT:     Head: Normocephalic.  Eyes:     Conjunctiva/sclera: Conjunctivae normal.  Neck:     Thyroid: No thyromegaly or thyroid tenderness.  Cardiovascular:     Rate and Rhythm: Normal rate and regular rhythm.     Pulses: Normal pulses.  Pulmonary:     Effort: Pulmonary effort is normal.     Breath sounds: Normal breath sounds.  Abdominal:     General: Bowel sounds are normal.  Neurological:     Mental Status: He is alert. Mental status is at baseline.  Psychiatric:        Mood and Affect: Mood normal.        Behavior: Behavior normal.        Thought Content: Thought content normal.        Judgment: Judgment normal.       06/01/2023    2:12 PM 03/20/2022    8:10 AM 03/19/2021    9:18 AM 03/16/2020   12:05 PM 09/23/2019    3:54 PM  Depression screen PHQ 2/9   Decreased Interest 0 0 0 0 0  Down, Depressed, Hopeless 0 0 0 2 0  PHQ - 2 Score 0 0 0 2 0  Altered sleeping 1  0 3 0  Tired, decreased energy 1  0 1 0  Change in appetite 0  0 0 0  Feeling bad or failure about yourself  0  0 0 0  Trouble concentrating 0  1 2 0  Moving slowly or fidgety/restless 0  0 0 0  Suicidal thoughts 0  0 0 0  PHQ-9 Score 2  1 8  0  Difficult doing work/chores Not difficult at all  Not difficult at all Somewhat difficult Not difficult at all       06/01/2023    2:12 PM 03/19/2021    9:18 AM 03/16/2020   12:06 PM  GAD 7 : Generalized Anxiety Score  Nervous, Anxious, on Edge 1 1 2   Control/stop worrying 0 0 1  Worry too much - different things 0 0 2  Trouble relaxing 1 1 2   Restless 0 0 1  Easily annoyed or irritable 1 1 2   Afraid - awful might happen 0 0 0  Total GAD 7 Score 3 3  10  Anxiety Difficulty Not difficult at all Not difficult at all Somewhat difficult      ASSESSMENT/PLAN:  Essential hypertension Assessment & Plan: Chronic.  Elevated today, repeat remains elevated.   Restart amlodipine 2.5 mg at night Continue Cozaar 100 mg at night Continue to monitor blood pressure, goal less than 130/80 Check c-Met Follow-up in 2 weeks or sooner if symptoms worsen  Orders: -     Hemoglobin A1c; Future -     Comprehensive metabolic panel; Future  Hyperlipidemia, unspecified hyperlipidemia type Assessment & Plan: Chronic. Refill Lipitor 10 mg daily Check fasting lipids  Orders: -     Lipid panel; Future  Vitamin D deficiency -     VITAMIN D 25 Hydroxy (Vit-D Deficiency, Fractures); Future  B12 deficiency -     Vitamin B12; Future  Screening PSA (prostate specific antigen) -     PSA  Iron deficiency -     CBC with Differential/Platelet; Future  Gastroesophageal reflux disease with esophagitis without hemorrhage Assessment & Plan: Chronic.  Well-controlled PPI.  EGD 01/23 shows grade a reflux esophagitis, multiple polyps,  gastritis Continue Protonix 20 mg daily Follow[facility] with [facility]   Hiatal hernia Assessment & Plan: Chronic.  Small hiatal hernia noted on 01/23    Chronic gastritis without bleeding, unspecified gastritis type Assessment & Plan: Chronic.  EGD 01/23 shows grade a reflux esophagitis, multiple polyps, gastritis Continue PPI Follow-up with GI as needed   Mood disorder [facility]) Assessment & Plan: Chronic.  Previously tried Wellbutrin without good results Continue Atarax 25 mg nightly Encourage CBT    Encounter for screening for HIV -     HIV Antibody (routine testing w rflx); Future  HCM Recommend tetanus booster Recommend shingles vaccine PSA screening labs Hepatitis C screening completed HIV screening lab  PDMP reviewed  Return in about 2 weeks (around 06/15/2023) for PCP.  [name], MD

## 2023-06-01 NOTE — Patient Instructions (Addendum)
It was a pleasure meeting you today. Thank you for allowing me to take part in your health care.  Our goals for today as we discussed include:  Recommend Shingles vaccine.  This is a 2 dose series and can be given at your local pharmacy.  Please talk to your pharmacist about this.   Recommend Tetanus Vaccination.  This is given every 10 years.   Restart Amlodipine 2.5 mg at night Take Losartan 100 mg at night If blood pressure <100/60 hold medication  Schedule fasting blood work in 1 week.  Fast for 10 hours  Follow up in 2 weeks  Schedule annual physical in 1-2 months  If you have any questions or concerns, please do not hesitate to call the office at [phone].  I look forward to our next visit and until then take care and stay safe.  Regards,   [name], MD   [facility]

## 2023-06-03 MED ORDER — ATORVASTATIN CALCIUM 10 MG PO TABS: 10.0000 mg | ORAL_TABLET | Freq: Every day | ORAL | 3 refills | Status: DC

## 2023-06-06 ENCOUNTER — Encounter: Payer: Self-pay | Admitting: Family Medicine

## 2023-06-06 DIAGNOSIS — Z125 Encounter for screening for malignant neoplasm of prostate: Secondary | ICD-10-CM | POA: Insufficient documentation

## 2023-06-06 DIAGNOSIS — Z114 Encounter for screening for human immunodeficiency virus [HIV]: Secondary | ICD-10-CM | POA: Insufficient documentation

## 2023-06-06 DIAGNOSIS — E611 Iron deficiency: Secondary | ICD-10-CM | POA: Insufficient documentation

## 2023-06-06 DIAGNOSIS — F39 Unspecified mood [affective] disorder: Secondary | ICD-10-CM | POA: Insufficient documentation

## 2023-06-06 DIAGNOSIS — E559 Vitamin D deficiency, unspecified: Secondary | ICD-10-CM | POA: Insufficient documentation

## 2023-06-06 DIAGNOSIS — E538 Deficiency of other specified B group vitamins: Secondary | ICD-10-CM | POA: Insufficient documentation

## 2023-06-06 NOTE — Assessment & Plan Note (Signed)
Chronic.  Small hiatal hernia noted on 01/23

## 2023-06-06 NOTE — Assessment & Plan Note (Signed)
Chronic.  Previously tried Wellbutrin without good results Continue Atarax 25 mg nightly Encourage CBT

## 2023-06-06 NOTE — Assessment & Plan Note (Addendum)
Chronic.  Elevated today, repeat remains elevated.   Restart amlodipine 2.5 mg at night Continue Cozaar 100 mg at night Continue to monitor blood pressure, goal less than 130/80 Check c-Met Follow-up in 2 weeks or sooner if symptoms worsen

## 2023-06-06 NOTE — Assessment & Plan Note (Signed)
Chronic.  EGD 01/23 shows grade a reflux esophagitis, multiple polyps, gastritis Continue PPI Follow-up with GI as needed

## 2023-06-06 NOTE — Assessment & Plan Note (Signed)
Chronic.  Well-controlled PPI.  EGD 01/23 shows grade a reflux esophagitis, multiple polyps, gastritis Continue Protonix 20 mg daily Follows with GI at [facility] clinic

## 2023-06-06 NOTE — Assessment & Plan Note (Signed)
Chronic. Refill Lipitor 10 mg daily Check fasting lipids

## 2023-06-11 ENCOUNTER — Other Ambulatory Visit (INDEPENDENT_AMBULATORY_CARE_PROVIDER_SITE_OTHER): Payer: Managed Care, Other (non HMO)

## 2023-06-11 DIAGNOSIS — I10 Essential (primary) hypertension: Secondary | ICD-10-CM | POA: Diagnosis not present

## 2023-06-11 DIAGNOSIS — E785 Hyperlipidemia, unspecified: Secondary | ICD-10-CM

## 2023-06-11 DIAGNOSIS — E611 Iron deficiency: Secondary | ICD-10-CM | POA: Diagnosis not present

## 2023-06-11 DIAGNOSIS — Z114 Encounter for screening for human immunodeficiency virus [HIV]: Secondary | ICD-10-CM

## 2023-06-11 DIAGNOSIS — E538 Deficiency of other specified B group vitamins: Secondary | ICD-10-CM | POA: Diagnosis not present

## 2023-06-11 DIAGNOSIS — E559 Vitamin D deficiency, unspecified: Secondary | ICD-10-CM | POA: Diagnosis not present

## 2023-06-11 LAB — COMPREHENSIVE METABOLIC PANEL
ALT: 24 U/L (ref 0–53)
AST: 22 U/L (ref 0–37)
Albumin: 4.5 g/dL (ref 3.5–5.2)
Alkaline Phosphatase: 83 U/L (ref 39–117)
BUN: 16 mg/dL (ref 6–23)
CO2: 31 mEq/L (ref 19–32)
Calcium: 9.5 mg/dL (ref 8.4–10.5)
Chloride: 103 mEq/L (ref 96–112)
Creatinine, Ser: 1.13 mg/dL (ref 0.40–1.50)
GFR: 73.78 mL/min (ref >60.00)
Glucose, Bld: 101 mg/dL — ABNORMAL HIGH (ref 70–99)
Potassium: 4.7 mEq/L (ref 3.5–5.1)
Sodium: 141 mEq/L (ref 135–145)
Total Bilirubin: 0.9 mg/dL (ref 0.2–1.2)
Total Protein: 7.3 g/dL (ref 6.0–8.3)

## 2023-06-11 LAB — LIPID PANEL
Cholesterol: 166 mg/dL (ref 0–200)
HDL: 50.1 mg/dL (ref >39.00)
LDL Cholesterol: 100 mg/dL — ABNORMAL HIGH (ref 0–99)
NonHDL: 115.79
Total CHOL/HDL Ratio: 3
Triglycerides: 81 mg/dL (ref 0.0–149.0)
VLDL: 16.2 mg/dL (ref 0.0–40.0)

## 2023-06-11 LAB — CBC WITH DIFFERENTIAL/PLATELET
Basophils Absolute: 0.1 10*3/uL (ref 0.0–0.1)
Basophils Relative: 2 % (ref 0.0–3.0)
Eosinophils Absolute: 0.2 10*3/uL (ref 0.0–0.7)
Eosinophils Relative: 3.2 % (ref 0.0–5.0)
HCT: 45.8 % (ref 39.0–52.0)
Hemoglobin: 15.4 g/dL (ref 13.0–17.0)
Lymphocytes Relative: 22.6 % (ref 12.0–46.0)
Lymphs Abs: 1.2 10*3/uL (ref 0.7–4.0)
MCHC: 33.7 g/dL (ref 30.0–36.0)
MCV: 88.2 fl (ref 78.0–100.0)
Monocytes Absolute: 0.4 10*3/uL (ref 0.1–1.0)
Monocytes Relative: 6.9 % (ref 3.0–12.0)
Neutro Abs: 3.5 10*3/uL (ref 1.4–7.7)
Neutrophils Relative %: 65.3 % (ref 43.0–77.0)
Platelets: 185 10*3/uL (ref 150.0–400.0)
RBC: 5.19 Mil/uL (ref 4.22–5.81)
RDW: 13.6 % (ref 11.5–15.5)
WBC: 5.3 10*3/uL (ref 4.0–10.5)

## 2023-06-11 LAB — VITAMIN D 25 HYDROXY (VIT D DEFICIENCY, FRACTURES): VITD: 42.32 ng/mL (ref 30.00–100.00)

## 2023-06-11 LAB — HEMOGLOBIN A1C: Hgb A1c MFr Bld: 5.5 % (ref 4.6–6.5)

## 2023-06-11 LAB — VITAMIN B12: Vitamin B-12: 839 pg/mL (ref 211–911)

## 2023-06-12 LAB — HIV ANTIBODY (ROUTINE TESTING W REFLEX): HIV 1&2 Ab, 4th Generation: NONREACTIVE

## 2023-06-14 NOTE — Patient Instructions (Signed)
It was a pleasure meeting you today. Thank you for allowing me to take part in your health care.  Our goals for today as we discussed include:  Will order ultrasound to check for stones Take ibuprofen 600 mg every 8 hours for pain as needed  Will get some labs today and notify your results.  4 weeks If you have any questions or concerns, please do not hesitate to call the office at [phone].  I look forward to our next visit and until then take care and stay safe.  Regards,   Janyth Riera, MD   [place]  

## 2023-06-14 NOTE — Progress Notes (Signed)
SUBJECTIVE:   Chief Complaint  Patient presents with   Medical Management of Chronic Issues   HPI Patient presents clinic to follow up hypertension.  No acute concerns today.  Hypertension Continues to be asymptomatic.  Since addition of Amlodipine 2.5 mg daily and taking Losartan 100 mg daily has noticed a decrease in blood pressure.  On one occasion noticed some dizziness and had low BP 100/60's.  This occurred in the evening after a meal, then running and later a glass of wine.  Stopped Losartan and continues to take Amlodipine 2.5 mg but increased to BID.  BP has been well managed with this current regime.  Denies any headaches, shortness of breath, chest pain, palpitations, or lower extremity edema.    Hyperlipidemia Takes Lipitor 10 mg daily.  Denies any myalgias.   PERTINENT PMH / PSH: Chronic mild gastritis Diverticulosis Mood disorder Hypertension Hyperlipidemia GERD  OBJECTIVE:  BP 134/84   Pulse 61   Temp 97.9 F (36.6 C)   Ht 5' 7.4" (1.712 m)   Wt 157 lb 3.2 oz (71.3 kg)   SpO2 98%   BMI 24.33 kg/m    Physical Exam Vitals reviewed.  Constitutional:      General: He is not in acute distress.    Appearance: Normal appearance. He is normal weight. He is not ill-appearing, toxic-appearing or diaphoretic.  Eyes:     General:        Right eye: No discharge.        Left eye: No discharge.  Cardiovascular:     Rate and Rhythm: Normal rate and regular rhythm.     Heart sounds: Normal heart sounds.  Pulmonary:     Effort: Pulmonary effort is normal.     Breath sounds: Normal breath sounds.  Skin:    General: Skin is warm and dry.  Neurological:     Mental Status: He is alert and oriented to person, place, and time. Mental status is at baseline.  Psychiatric:        Mood and Affect: Mood normal.        Behavior: Behavior normal.        Thought Content: Thought content normal.        Judgment: Judgment normal.       06/15/2023    3:55 PM 06/01/2023     2:12 PM 03/20/2022    8:10 AM 03/19/2021    9:18 AM 03/16/2020   12:05 PM  Depression screen PHQ 2/9  Decreased Interest 0 0 0 0 0  Down, Depressed, Hopeless 0 0 0 0 2  PHQ - 2 Score 0 0 0 0 2  Altered sleeping 0 1  0 3  Tired, decreased energy 0 1  0 1  Change in appetite 0 0  0 0  Feeling bad or failure about yourself  0 0  0 0  Trouble concentrating 0 0  1 2  Moving slowly or fidgety/restless 0 0  0 0  Suicidal thoughts 0 0  0 0  PHQ-9 Score 0 2  1 8   Difficult doing work/chores Not difficult at all Not difficult at all  Not difficult at all Somewhat difficult        06/15/2023    3:55 PM 06/01/2023    2:12 PM 03/19/2021    9:18 AM 03/16/2020   12:06 PM  GAD 7 : Generalized Anxiety Score  Nervous, Anxious, on Edge 1 1 1 2   Control/stop worrying 0 0 0 1  Worry  too much - different things 1 0 0 2  Trouble relaxing 0 1 1 2   Restless 0 0 0 1  Easily annoyed or irritable 0 1 1 2   Afraid - awful might happen 0 0 0 0  Total GAD 7 Score 2 3 3 10   Anxiety Difficulty Not difficult at all Not difficult at all Not difficult at all Somewhat difficult     The 10-year ASCVD risk score (Arnett DK, et al., 2019) is: 5.2%   Values used to calculate the score:     Age: 52 years     Sex: Male     Is Non-Hispanic African American: No     Diabetic: No     Tobacco smoker: No     Systolic Blood Pressure: 134 mmHg     Is BP treated: Yes     HDL Cholesterol: 50.1 mg/dL     Total Cholesterol: 166 mg/dL   ASSESSMENT/PLAN:  Essential hypertension Assessment & Plan: Chronic.  Well controlled on Amlodipine Increase amlodipine 2.5 mg BID Discontinue Cozaar 100 mg  Continue to monitor blood pressure, goal less than 130/80  Follow up in 2 months or sooner if BP not at goal or becomes symptomatic  Orders: -     amLODIPine Besylate; Take 1 tablet (2.5 mg total) by mouth 2 (two) times daily.  Dispense: 180 tablet; Refill: 3  Hyperlipidemia, unspecified hyperlipidemia type Assessment &  Plan: Chronic. The 10-year ASCVD risk score (Arnett DK, et al., 2019) is: 5.2%  Continue Lipitor 10 mg daily    HCM Recommend tetanus booster Recommend shingles vaccine PSA screening labs at next visit Hepatitis C/HIV  screening completed Colonoscopy up to date. Diverticulosis/IH Dr. Evalyn Casco [facility] repeat in 5 years.  Due 11/2025 Depression/GAD screening completed   PDMP reviewed  Return in about 8 weeks (around 08/12/2023) for PCP.  [name], MD

## 2023-06-15 ENCOUNTER — Encounter: Payer: Self-pay | Admitting: Family Medicine

## 2023-06-15 ENCOUNTER — Ambulatory Visit (INDEPENDENT_AMBULATORY_CARE_PROVIDER_SITE_OTHER): Payer: Managed Care, Other (non HMO) | Admitting: Family Medicine

## 2023-06-15 VITALS — BP 134/84 | HR 61 | Temp 97.9°F | Ht 67.4 in | Wt 157.2 lb

## 2023-06-15 DIAGNOSIS — E785 Hyperlipidemia, unspecified: Secondary | ICD-10-CM | POA: Diagnosis not present

## 2023-06-15 DIAGNOSIS — F39 Unspecified mood [affective] disorder: Secondary | ICD-10-CM

## 2023-06-15 DIAGNOSIS — E538 Deficiency of other specified B group vitamins: Secondary | ICD-10-CM

## 2023-06-15 DIAGNOSIS — I10 Essential (primary) hypertension: Secondary | ICD-10-CM

## 2023-06-15 DIAGNOSIS — E559 Vitamin D deficiency, unspecified: Secondary | ICD-10-CM

## 2023-06-15 DIAGNOSIS — E611 Iron deficiency: Secondary | ICD-10-CM

## 2023-06-15 DIAGNOSIS — K2100 Gastro-esophageal reflux disease with esophagitis, without bleeding: Secondary | ICD-10-CM

## 2023-06-15 MED ORDER — AMLODIPINE BESYLATE 2.5 MG PO TABS
2.5000 mg | ORAL_TABLET | Freq: Two times a day (BID) | ORAL | 3 refills | Status: DC
Start: 2023-06-15 — End: 2024-03-16

## 2023-06-21 ENCOUNTER — Encounter: Payer: Self-pay | Admitting: Family Medicine

## 2023-06-21 NOTE — Assessment & Plan Note (Signed)
Chronic. The 10-year ASCVD risk score (Arnett DK, et al., 2019) is: 5.2%  Continue Lipitor 10 mg daily

## 2023-06-21 NOTE — Assessment & Plan Note (Addendum)
Chronic.  Well controlled on Amlodipine Increase amlodipine 2.5 mg BID Discontinue Cozaar 100 mg  Continue to monitor blood pressure, goal less than 130/80  Follow up in 2 months or sooner if BP not at goal or becomes symptomatic

## 2023-06-24 ENCOUNTER — Encounter: Payer: Self-pay | Admitting: Family Medicine

## 2023-06-26 ENCOUNTER — Encounter: Payer: Self-pay | Admitting: Nurse Practitioner

## 2023-06-26 ENCOUNTER — Ambulatory Visit (INDEPENDENT_AMBULATORY_CARE_PROVIDER_SITE_OTHER): Payer: Managed Care, Other (non HMO) | Admitting: Nurse Practitioner

## 2023-06-26 VITALS — BP 130/82 | HR 79 | Temp 98.7°F | Ht 67.4 in | Wt 156.4 lb

## 2023-06-26 DIAGNOSIS — L237 Allergic contact dermatitis due to plants, except food: Secondary | ICD-10-CM

## 2023-06-26 DIAGNOSIS — U071 COVID-19: Secondary | ICD-10-CM

## 2023-06-26 MED ORDER — PREDNISONE 10 MG PO TABS
ORAL_TABLET | ORAL | 0 refills | Status: DC
Start: 2023-06-26 — End: 2023-07-07

## 2023-06-26 MED ORDER — NIRMATRELVIR/RITONAVIR (PAXLOVID)TABLET
3.0000 | ORAL_TABLET | Freq: Two times a day (BID) | ORAL | 0 refills | Status: AC
Start: 2023-06-26 — End: 2023-07-01

## 2023-06-26 NOTE — Progress Notes (Signed)
Bethanie Dicker, NP-C Phone: [phone]  Anthony Patton. is a 52 y.o. male who presents today for poison ivy and COVID.   Patient tested positive for COVID this morning at home. His symptoms started yesterday afternoon. He has had COVID in the past and was treated with Paxlovid. He also has 2 areas of what he believes is poison ivy, as he was exposed, on his left knee and left wrist. It has been there for 3 days. It is pruritic and red. He has been using calamine lotion with mild relief.   Respiratory illness:  Cough- Yes  Congestion-    Sinus- Yes, nasal   Chest- No  Post nasal drip- Mild  Sore throat- No  Shortness of breath- No  Fever- Low grade last night  Fatigue/Myalgia- Yes Headache- Yes Nausea/Vomiting- No Taste disturbance- No  Smell disturbance- No  Covid exposure- No  Covid vaccination- x 4  Flu vaccination- Not due  Medications- None  Social History   Tobacco Use  Smoking Status Never  Smokeless Tobacco Never    Current Outpatient Medications on File Prior to Visit  Medication Sig Dispense Refill   amLODipine (NORVASC) 2.5 MG tablet Take 1 tablet (2.5 mg total) by mouth 2 (two) times daily. 180 tablet 3   atorvastatin (LIPITOR) 10 MG tablet Take 1 tablet (10 mg total) by mouth daily at 6 PM. 90 tablet 3   Multiple Vitamin (MULTIVITAMIN) capsule Take 1 capsule by mouth daily.     pantoprazole (PROTONIX) 20 MG tablet TAKE ONE TABLET BY MOUTH DAILY 30 MINUTES BEFORE FOOD 90 tablet 3   sildenafil (REVATIO) 20 MG tablet Take 1-5 tablets (20-100 mg total) by mouth daily as needed. 30 tablet 11   No current facility-administered medications on file prior to visit.     ROS see history of present illness  Objective  Physical Exam Vitals:   06/26/23 0813 06/26/23 0835  BP: (!) 146/79 130/82  Pulse: 79   Temp: 98.7 F (37.1 C)   SpO2: 99%     BP Readings from Last 3 Encounters:  06/26/23 130/82  06/15/23 134/84  06/01/23 (!) 140/86   Wt Readings  from Last 3 Encounters:  06/26/23 156 lb 6.4 oz (70.9 kg)  06/15/23 157 lb 3.2 oz (71.3 kg)  06/01/23 157 lb 12.8 oz (71.6 kg)    Physical Exam Constitutional:      General: He is not in acute distress.    Appearance: Normal appearance.  HENT:     Head: Normocephalic.     Right Ear: Tympanic membrane normal.     Left Ear: Tympanic membrane normal.     Nose: Nose normal.     Mouth/Throat:     Mouth: Mucous membranes are moist.     Pharynx: Oropharynx is clear.  Eyes:     Conjunctiva/sclera: Conjunctivae normal.     Pupils: Pupils are equal, round, and reactive to light.  Cardiovascular:     Rate and Rhythm: Normal rate and regular rhythm.     Heart sounds: Normal heart sounds.  Pulmonary:     Effort: Pulmonary effort is normal.     Breath sounds: Normal breath sounds.  Abdominal:     General: Abdomen is flat. Bowel sounds are normal.     Palpations: Abdomen is soft. There is no mass.     Tenderness: There is no abdominal tenderness.  Lymphadenopathy:     Cervical: No cervical adenopathy.  Skin:    General: Skin is warm and dry.  Findings: Rash (inner left knee and left wrist) present. Rash is pustular.  Neurological:     General: No focal deficit present.     Mental Status: He is alert.  Psychiatric:        Mood and Affect: Mood normal.        Behavior: Behavior normal.      Assessment/Plan: Please see individual problem list.  Positive self-administered antigen test for COVID-19 Assessment & Plan: Positive COVID test this morning at home. Symptoms started yesterday. He is interested in treatment with Paxlovid, as he has been treated for COVID in the past with it and felt that it helped. Rx sent. Counseled on side effects. He will not take his Lipitor and Sildenafil while taking the Paxlovid and for 2-3 days after completing the medication. Counseled on quarantine protocol. Advised adequate fluid intake. Strict return precautions given to patient.   Orders: -      nirmatrelvir/ritonavir; Take 3 tablets by mouth 2 (two) times daily for 5 days. (Take nirmatrelvir 150 mg two tablets twice daily for 5 days and ritonavir 100 mg one tablet twice daily for 5 days) Patient GFR is 73.78  Dispense: 30 tablet; Refill: 0  Poison ivy dermatitis Assessment & Plan: Will treat with Prednisone taper. Advised to continue Allegra daily and calamine lotion to help with itch relief.   Orders: -     predniSONE; TAKE 3 TABLETS PO QD FOR 3 DAYS THEN TAKE 2 TABLETS PO QD FOR 3 DAYS THEN TAKE 1 TABLET PO QD FOR 3 DAYS THEN TAKE 1/2 TAB PO QD FOR 3 DAYS  Dispense: 20 tablet; Refill: 0   Return if symptoms worsen or fail to improve.   Bethanie Dicker, NP-C [facility] - [employer]

## 2023-06-26 NOTE — Assessment & Plan Note (Signed)
Will treat with Prednisone taper. Advised to continue Allegra daily and calamine lotion to help with itch relief.

## 2023-06-26 NOTE — Assessment & Plan Note (Signed)
Positive COVID test this morning at home. Symptoms started yesterday. He is interested in treatment with Paxlovid, as he has been treated for COVID in the past with it and felt that it helped. Rx sent. Counseled on side effects. He will not take his Lipitor and Sildenafil while taking the Paxlovid and for 2-3 days after completing the medication. Counseled on quarantine protocol. Advised adequate fluid intake. Strict return precautions given to patient.

## 2023-07-07 ENCOUNTER — Ambulatory Visit: Payer: Managed Care, Other (non HMO) | Admitting: Internal Medicine

## 2023-07-07 ENCOUNTER — Encounter: Payer: Self-pay | Admitting: Internal Medicine

## 2023-07-07 VITALS — BP 130/78 | HR 75 | Temp 97.9°F | Ht 67.5 in | Wt 154.6 lb

## 2023-07-07 DIAGNOSIS — K219 Gastro-esophageal reflux disease without esophagitis: Secondary | ICD-10-CM | POA: Diagnosis not present

## 2023-07-07 DIAGNOSIS — K645 Perianal venous thrombosis: Secondary | ICD-10-CM | POA: Diagnosis not present

## 2023-07-07 MED ORDER — HYDROCORTISONE (PERIANAL) 2.5 % EX CREA: 1.0000 | TOPICAL_CREAM | Freq: Two times a day (BID) | CUTANEOUS | 0 refills | Status: DC

## 2023-07-07 MED ORDER — PANTOPRAZOLE SODIUM 20 MG PO TBEC: DELAYED_RELEASE_TABLET | ORAL | 3 refills | Status: DC

## 2023-07-07 MED ORDER — DIBUCAINE (PERIANAL) 1 % EX OINT: 1.0000 | TOPICAL_OINTMENT | CUTANEOUS | 1 refills | Status: DC | PRN

## 2023-07-07 NOTE — Progress Notes (Signed)
Subjective:  Patient ID: Anthony Pierini., male    DOB: Jun 25, 1971  Age: 52 y.o. MRN: [id number]  CC: The primary encounter diagnosis was External hemorrhoid, thrombosed. A diagnosis of Gastroesophageal reflux disease was also pertinent to this visit.   HPI Anthony Pierini. presents for  Chief Complaint  Patient presents with   Hemorrhoids   52 yr old male presents for evaluation and treatment of a painful external hemorrhoid, became symptomatic after driving back from [place]  (8 hour drive ).  Use dTucks pads last night.  Pain unpleasant, aggravated by changes in position ,  last BM yesterday formed  no bleeding     Outpatient Medications Prior to Visit  Medication Sig Dispense Refill   amLODipine (NORVASC) 2.5 MG tablet Take 1 tablet (2.5 mg total) by mouth 2 (two) times daily. 180 tablet 3   atorvastatin (LIPITOR) 10 MG tablet Take 1 tablet (10 mg total) by mouth daily at 6 PM. 90 tablet 3   Multiple Vitamin (MULTIVITAMIN) capsule Take 1 capsule by mouth daily.     sildenafil (REVATIO) 20 MG tablet Take 1-5 tablets (20-100 mg total) by mouth daily as needed. 30 tablet 11   pantoprazole (PROTONIX) 20 MG tablet TAKE ONE TABLET BY MOUTH DAILY 30 MINUTES BEFORE FOOD 90 tablet 3   predniSONE (DELTASONE) 10 MG tablet TAKE 3 TABLETS PO QD FOR 3 DAYS THEN TAKE 2 TABLETS PO QD FOR 3 DAYS THEN TAKE 1 TABLET PO QD FOR 3 DAYS THEN TAKE 1/2 TAB PO QD FOR 3 DAYS (Patient not taking: Reported on 07/07/2023) 20 tablet 0   No facility-administered medications prior to visit.    Review of Systems;  Patient denies headache, fevers, malaise, unintentional weight loss, skin rash, eye pain, sinus congestion and sinus pain, sore throat, dysphagia,  hemoptysis , cough, dyspnea, wheezing, chest pain, palpitations, orthopnea, edema, abdominal pain, nausea, melena, diarrhea, constipation, flank pain, dysuria, hematuria, urinary  Frequency, nocturia, numbness, tingling, seizures,  Focal weakness, Loss of  consciousness,  Tremor, insomnia, depression, anxiety, and suicidal ideation.      Objective:  BP 130/78   Pulse 75   Temp 97.9 F (36.6 C) (Oral)   Ht 5' 7.5" (1.715 m)   Wt 154 lb 9.6 oz (70.1 kg)   SpO2 98%   BMI 23.86 kg/m   BP Readings from Last 3 Encounters:  07/07/23 130/78  06/26/23 130/82  06/15/23 134/84    Wt Readings from Last 3 Encounters:  07/07/23 154 lb 9.6 oz (70.1 kg)  06/26/23 156 lb 6.4 oz (70.9 kg)  06/15/23 157 lb 3.2 oz (71.3 kg)    Physical Exam Vitals reviewed.  Constitutional:      General: He is not in acute distress.    Appearance: Normal appearance. He is normal weight. He is not ill-appearing, toxic-appearing or diaphoretic.  HENT:     Head: Normocephalic and atraumatic.     Right Ear: Tympanic membrane, ear canal and external ear normal. There is no impacted cerumen.     Left Ear: Tympanic membrane, ear canal and external ear normal. There is no impacted cerumen.     Nose: Nose normal.     Mouth/Throat:     Mouth: Mucous membranes are moist.     Pharynx: Oropharynx is clear.  Eyes:     General: No scleral icterus.       Right eye: No discharge.        Left eye: No discharge.     Conjunctiva/sclera:  Conjunctivae normal.  Neck:     Thyroid: No thyromegaly.     Vascular: No carotid bruit or JVD.  Cardiovascular:     Rate and Rhythm: Normal rate and regular rhythm.     Heart sounds: Normal heart sounds.  Pulmonary:     Effort: Pulmonary effort is normal. No respiratory distress.     Breath sounds: Normal breath sounds.  Abdominal:     General: Bowel sounds are normal.     Palpations: Abdomen is soft. There is no mass.     Tenderness: There is no abdominal tenderness. There is no guarding or rebound.  Genitourinary:    Rectum: External hemorrhoid present.       Comments: Thrombosed external hemorrhoid, 2 cm in size Musculoskeletal:        General: Normal range of motion.     Cervical back: Normal range of motion and neck  supple.  Lymphadenopathy:     Cervical: No cervical adenopathy.  Skin:    General: Skin is warm and dry.  Neurological:     General: No focal deficit present.     Mental Status: He is alert and oriented to person, place, and time. Mental status is at baseline.  Psychiatric:        Mood and Affect: Mood normal.        Behavior: Behavior normal.        Thought Content: Thought content normal.        Judgment: Judgment normal.    Lab Results  Component Value Date   HGBA1C 5.5 06/11/2023    Lab Results  Component Value Date   CREATININE 1.13 06/11/2023   CREATININE 1.09 03/20/2022   CREATININE 1.12 10/11/2021    Lab Results  Component Value Date   WBC 5.3 06/11/2023   HGB 15.4 06/11/2023   HCT 45.8 06/11/2023   PLT 185.0 06/11/2023   GLUCOSE 101 (H) 06/11/2023   CHOL 166 06/11/2023   TRIG 81.0 06/11/2023   HDL 50.10 06/11/2023   LDLCALC 100 (H) 06/11/2023   ALT 24 06/11/2023   AST 22 06/11/2023   NA 141 06/11/2023   K 4.7 06/11/2023   CL 103 06/11/2023   CREATININE 1.13 06/11/2023   BUN 16 06/11/2023   CO2 31 06/11/2023   TSH 0.84 03/20/2022   PSA 0.75 03/20/2022   HGBA1C 5.5 06/11/2023    DG Chest 2 View  Result Date: 02/27/2020 CLINICAL DATA:  Chest pain EXAM: CHEST - 2 VIEW COMPARISON:  None. FINDINGS: The heart size and mediastinal contours are within normal limits. Both lungs are clear. The visualized skeletal structures are unremarkable. IMPRESSION: Normal study. Electronically Signed   By: Charlett Nose M.D.   On: 02/27/2020 16:59    Assessment & Plan:  .External hemorrhoid, thrombosed Assessment & Plan:  Analgesics  steroid cream  sitz baths,  gen surg referral made.  Has appt with Tonna Boehringer at [facility] tomorrow   Orders: -     Ambulatory referral to General Surgery  Gastroesophageal reflux disease -     Pantoprazole Sodium; TAKE ONE TABLET BY MOUTH DAILY 30 MINUTES BEFORE FOOD  Dispense: 90 tablet; Refill: 3  Other orders -     Dibucaine (Perianal);  Place 1 Application rectally as needed for hemorrhoids.  Dispense: 28 g; Refill: 1 -     Hydrocortisone (Perianal); Place 1 Application rectally 2 (two) times daily.  Dispense: 30 g; Refill: 0     Follow-up: No follow-ups on file.   Sherlene Shams,  MD

## 2023-07-07 NOTE — Patient Instructions (Addendum)
Your hemorrhoid appears to be thrombosed as well as swollen.  Soak in warm salt water ( made with epsom salts)  for 15 minutes  2 times daily followed by use of the ointments  below.    You have an appointment with Dr. Tonna Boehringer at 1:45 tomorrow at [facility].  Please arrive  by 1:30.  Use the nupercainal (numbing cream)  and the anusol HC (steroid cream to help shrink it) for the next 24 hours  Dr Tonna Boehringer may tell you that it needs to be "opened"  (to remove a clot)  or he may tell you to keep doing the ointments.

## 2023-07-07 NOTE — Assessment & Plan Note (Signed)
Analgesics  steroid cream  sitz baths,  gen surg referral made.  Has appt with Anthony Patton at [facility]

## 2023-07-20 ENCOUNTER — Telehealth: Payer: Self-pay | Admitting: Family Medicine

## 2023-07-20 NOTE — Telephone Encounter (Signed)
Pt called in stating that he saw [name] on 06/01/23 and 06/15/23 and he stated that he's getting charge for both visits. He stated they were coded wrong. Please advice.

## 2023-07-21 NOTE — Telephone Encounter (Signed)
I called the patient and explained what the coder relayed to me regarding the coding of his office visit. I explained to the patient that a physical was not done at that appointment it was an appointment to transfer his care to the his new provider. He stated he did not understand why he has to pay for an Annual well visit with the provider . I reiterated that the appointment was not an annual well visit or a physical as the insurance company state is a annual well visit ,but a transfer of care which was coded as a low specificity office visit. He stated he did not understand why his wife visit was paid by the insurance carrier and his was not. I asked if she had the same insurance he stated she did not she has [employer]. I informed him that not all insurance cover the same way. He stated if I could look at her insurance and see why [employer] paid and his did not . I informed him I could not violate patient confidentiality and he stated he could respect that. I stated if he would like his wife to call the office and give me permission to discuss his chart information I would be glad to do so. He stated his wife would be getting off of work in about 20 min. And he gave me verbal permission to talk with his wife and discuss his billing situation. I stated I would wait on the call.

## 2023-07-29 ENCOUNTER — Encounter (INDEPENDENT_AMBULATORY_CARE_PROVIDER_SITE_OTHER): Payer: Self-pay

## 2023-07-31 ENCOUNTER — Encounter: Payer: Self-pay | Admitting: Family Medicine

## 2023-08-04 ENCOUNTER — Other Ambulatory Visit: Payer: Self-pay | Admitting: Family Medicine

## 2023-08-04 MED ORDER — LEVOCETIRIZINE DIHYDROCHLORIDE 5 MG PO TABS: 5.0000 mg | ORAL_TABLET | Freq: Every evening | ORAL | 3 refills | Status: DC

## 2023-08-05 ENCOUNTER — Other Ambulatory Visit: Payer: Self-pay

## 2023-08-05 MED ORDER — LEVOCETIRIZINE DIHYDROCHLORIDE 5 MG PO TABS: 5.0000 mg | ORAL_TABLET | Freq: Every evening | ORAL | 3 refills | Status: DC

## 2023-08-05 NOTE — Telephone Encounter (Signed)
Called and informed pt that rx was sent into the pharmacy

## 2023-08-12 ENCOUNTER — Encounter: Payer: Self-pay | Admitting: Family Medicine

## 2023-08-12 ENCOUNTER — Ambulatory Visit: Payer: Managed Care, Other (non HMO) | Admitting: Family Medicine

## 2023-08-12 VITALS — BP 126/78 | HR 65 | Temp 97.7°F | Resp 16 | Ht 66.25 in | Wt 154.5 lb

## 2023-08-12 DIAGNOSIS — F39 Unspecified mood [affective] disorder: Secondary | ICD-10-CM

## 2023-08-12 DIAGNOSIS — Z0000 Encounter for general adult medical examination without abnormal findings: Secondary | ICD-10-CM | POA: Diagnosis not present

## 2023-08-12 DIAGNOSIS — I10 Essential (primary) hypertension: Secondary | ICD-10-CM | POA: Diagnosis not present

## 2023-08-12 DIAGNOSIS — Z125 Encounter for screening for malignant neoplasm of prostate: Secondary | ICD-10-CM

## 2023-08-12 LAB — PSA: PSA: 1.19 ng/mL (ref 0.10–4.00)

## 2023-08-12 MED ORDER — HYDROXYZINE HCL 25 MG PO TABS
25.0000 mg | ORAL_TABLET | Freq: Every evening | ORAL | 3 refills | Status: DC | PRN
Start: 2023-08-12 — End: 2024-03-16

## 2023-08-12 NOTE — Progress Notes (Addendum)
SUBJECTIVE:   Chief Complaint  Patient presents with   Annual Exam   HPI Patient presents clinic for annual physical  No acute concerns today.  Recently seen General surgery for incision of thrombosed hemorrhoid procedure in office. Doing well since then.   Hypertension Asymptomatic.  Doing well on Amlodipine 2.5 mg BID.   Continues to be asymptomatic. Denies any chest pain, shortness of breath or lower extremity edema. No further episodes of dizziness.  Mood Disorder Doing well. Takes Hydroxyzine 25 mg at night for anxiety and sleep.  Requesting refills.   PERTINENT PMH / PSH: Chronic mild gastritis Diverticulosis Mood disorder Hypertension Hyperlipidemia GERD  OBJECTIVE:  BP 126/78   Pulse 65   Temp 97.7 F (36.5 C)   Resp 16   Ht 5' 6.25" (1.683 m)   Wt 154 lb 8 oz (70.1 kg)   SpO2 97%   BMI 24.75 kg/m    Physical Exam Vitals reviewed.  HENT:     Head: Normocephalic.     Right Ear: Tympanic membrane, ear canal and external ear normal.     Left Ear: Tympanic membrane, ear canal and external ear normal.     Nose: Nose normal.     Mouth/Throat:     Mouth: Mucous membranes are moist.  Eyes:     Conjunctiva/sclera: Conjunctivae normal.  Neck:     Thyroid: No thyromegaly or thyroid tenderness.  Cardiovascular:     Rate and Rhythm: Normal rate and regular rhythm.     Pulses: Normal pulses.     Heart sounds: Normal heart sounds.  Pulmonary:     Effort: Pulmonary effort is normal.     Breath sounds: Normal breath sounds.  Abdominal:     General: Abdomen is flat. Bowel sounds are normal. There is no distension.     Palpations: Abdomen is soft.     Tenderness: There is no guarding.  Musculoskeletal:        General: Normal range of motion.     Cervical back: Normal range of motion and neck supple.     Right lower leg: No edema.     Left lower leg: No edema.  Lymphadenopathy:     Cervical: No cervical adenopathy.  Neurological:     General: No focal  deficit present.     Mental Status: He is alert and oriented to person, place, and time. Mental status is at baseline.  Psychiatric:        Mood and Affect: Mood normal.        Behavior: Behavior normal.        Thought Content: Thought content normal.        Judgment: Judgment normal.       08/12/2023    8:34 AM 07/07/2023    1:33 PM 06/15/2023    3:55 PM 06/01/2023    2:12 PM 03/20/2022    8:10 AM  Depression screen PHQ 2/9  Decreased Interest 0 0 0 0 0  Down, Depressed, Hopeless 0 0 0 0 0  PHQ - 2 Score 0 0 0 0 0  Altered sleeping 0 0 0 1   Tired, decreased energy 0 0 0 1   Change in appetite 0 0 0 0   Feeling bad or failure about yourself  0 0 0 0   Trouble concentrating 0 0 0 0   Moving slowly or fidgety/restless 0 0 0 0   Suicidal thoughts 0 0 0 0   PHQ-9 Score 0 0 0  2   Difficult doing work/chores Not difficult at all Not difficult at all Not difficult at all Not difficult at all         08/12/2023    8:35 AM 06/15/2023    3:55 PM 06/01/2023    2:12 PM 03/19/2021    9:18 AM  GAD 7 : Generalized Anxiety Score  Nervous, Anxious, on Edge 0 1 1 1   Control/stop worrying 0 0 0 0  Worry too much - different things 0 1 0 0  Trouble relaxing 0 0 1 1  Restless 0 0 0 0  Easily annoyed or irritable 0 0 1 1  Afraid - awful might happen 0 0 0 0  Total GAD 7 Score 0 2 3 3   Anxiety Difficulty Not difficult at all Not difficult at all Not difficult at all Not difficult at all     The 10-year ASCVD risk score (Arnett DK, et al., 2019) is: 4.6%   Values used to calculate the score:     Age: 52 years     Sex: Male     Is Non-Hispanic African American: No     Diabetic: No     Tobacco smoker: No     Systolic Blood Pressure: 126 mmHg     Is BP treated: Yes     HDL Cholesterol: 50.1 mg/dL     Total Cholesterol: 166 mg/dL  ASSESSMENT/PLAN:  Annual physical exam Assessment & Plan: HCM Recommend tetanus booster Recommend shingles vaccine PSA screening labs today Hepatitis C/HIV   screening completed Colonoscopy up to date. Diverticulosis/IH Dr. Evalyn Casco [facility] repeat in 5 years.  Due 11/2025 Depression/GAD screening completed Low risk falls Continue healthy lifestyle and exercise    Mood disorder [facility]) Assessment & Plan: Chronic.  Denies SI/HI Refill Atarax 25 mg nightly Encourage CBT   Orders: -     hydrOXYzine HCl; Take 1-2 tablets (25-50 mg total) by mouth at bedtime as needed. Or BID prn  Dispense: 180 tablet; Refill: 3  Essential hypertension Assessment & Plan: Chronic.  Well controlled on Amlodipine Continue amlodipine 2.5 mg BID Continue to monitor blood pressure, goal less than 130/80     Prostate cancer screening -     PSA      PDMP reviewed  No follow-ups on file.  [name], MD

## 2023-08-12 NOTE — Assessment & Plan Note (Signed)
Chronic.  Denies SI/HI Refill Atarax 25 mg nightly Encourage CBT

## 2023-08-12 NOTE — Patient Instructions (Signed)
It was a pleasure meeting you today. Thank you for allowing me to take part in your health care.  Our goals for today as we discussed include:  Continue current medication  Refill sent for Hydroxyzine  PSA labs today  Follow up as needed  This is a list of the screening recommended for you and due dates:  Health Maintenance  Topic Date Due   COVID-19 Vaccine (5 - 2023-24 season) 08/29/2022   Zoster (Shingles) Vaccine (1 of 2) 11/12/2023*   Flu Shot  03/28/2024*   Colon Cancer Screening  12/13/2025   Hepatitis C Screening  Completed   HIV Screening  Completed   HPV Vaccine  Aged Out   DTaP/Tdap/Td vaccine  Discontinued  *Topic was postponed. The date shown is not the original due date.      If you have any questions or concerns, please do not hesitate to call the office at [phone].  I look forward to our next visit and until then take care and stay safe.  Regards,   [name], MD   [facility]

## 2023-08-12 NOTE — Assessment & Plan Note (Signed)
HCM Recommend tetanus booster Recommend shingles vaccine PSA screening labs today Hepatitis C/HIV  screening completed Colonoscopy up to date. Diverticulosis/IH Dr. Evalyn Casco [facility] repeat in 5 years.  Due 11/2025 Depression/GAD screening completed Low risk falls Continue healthy lifestyle and exercise

## 2023-08-12 NOTE — Assessment & Plan Note (Signed)
Chronic.  Well controlled on Amlodipine Continue amlodipine 2.5 mg BID Continue to monitor blood pressure, goal less than 130/80

## 2023-08-13 ENCOUNTER — Encounter: Payer: Self-pay | Admitting: Family Medicine

## 2023-12-15 ENCOUNTER — Other Ambulatory Visit: Payer: Self-pay

## 2023-12-15 DIAGNOSIS — E785 Hyperlipidemia, unspecified: Secondary | ICD-10-CM

## 2023-12-15 MED ORDER — ATORVASTATIN CALCIUM 10 MG PO TABS
10.0000 mg | ORAL_TABLET | Freq: Every day | ORAL | 3 refills | Status: DC
Start: 2023-12-15 — End: 2023-12-16

## 2023-12-16 ENCOUNTER — Other Ambulatory Visit: Payer: Self-pay

## 2023-12-16 DIAGNOSIS — E785 Hyperlipidemia, unspecified: Secondary | ICD-10-CM

## 2023-12-16 MED ORDER — ATORVASTATIN CALCIUM 10 MG PO TABS: 10.0000 mg | ORAL_TABLET | Freq: Every day | ORAL | 3 refills | Status: DC

## 2024-03-09 ENCOUNTER — Encounter: Payer: Self-pay | Admitting: Family Medicine

## 2024-03-16 ENCOUNTER — Encounter: Payer: Self-pay | Admitting: Family Medicine

## 2024-03-16 ENCOUNTER — Ambulatory Visit (INDEPENDENT_AMBULATORY_CARE_PROVIDER_SITE_OTHER): Admitting: Family Medicine

## 2024-03-16 VITALS — BP 124/74 | HR 70 | Temp 98.2°F | Resp 20 | Ht 66.25 in | Wt 155.4 lb

## 2024-03-16 DIAGNOSIS — J309 Allergic rhinitis, unspecified: Secondary | ICD-10-CM

## 2024-03-16 DIAGNOSIS — E782 Mixed hyperlipidemia: Secondary | ICD-10-CM

## 2024-03-16 DIAGNOSIS — I10 Essential (primary) hypertension: Secondary | ICD-10-CM | POA: Diagnosis not present

## 2024-03-16 DIAGNOSIS — F39 Unspecified mood [affective] disorder: Secondary | ICD-10-CM

## 2024-03-16 DIAGNOSIS — N529 Male erectile dysfunction, unspecified: Secondary | ICD-10-CM

## 2024-03-16 DIAGNOSIS — Z125 Encounter for screening for malignant neoplasm of prostate: Secondary | ICD-10-CM

## 2024-03-16 DIAGNOSIS — I259 Chronic ischemic heart disease, unspecified: Secondary | ICD-10-CM | POA: Diagnosis not present

## 2024-03-16 DIAGNOSIS — K219 Gastro-esophageal reflux disease without esophagitis: Secondary | ICD-10-CM

## 2024-03-16 DIAGNOSIS — K2100 Gastro-esophageal reflux disease with esophagitis, without bleeding: Secondary | ICD-10-CM

## 2024-03-16 MED ORDER — LEVOCETIRIZINE DIHYDROCHLORIDE 5 MG PO TABS
5.0000 mg | ORAL_TABLET | Freq: Every evening | ORAL | 3 refills | Status: AC
Start: 2024-03-16 — End: ?

## 2024-03-16 MED ORDER — AMLODIPINE BESYLATE 2.5 MG PO TABS: 2.5000 mg | ORAL_TABLET | Freq: Two times a day (BID) | ORAL | 3 refills | Status: AC

## 2024-03-16 MED ORDER — HYDROXYZINE HCL 25 MG PO TABS: 25.0000 mg | ORAL_TABLET | Freq: Every evening | ORAL | 3 refills | Status: DC | PRN

## 2024-03-16 MED ORDER — PANTOPRAZOLE SODIUM 20 MG PO TBEC
DELAYED_RELEASE_TABLET | ORAL | 3 refills | Status: AC
Start: 2024-03-16 — End: ?

## 2024-03-16 MED ORDER — ATORVASTATIN CALCIUM 10 MG PO TABS: 10.0000 mg | ORAL_TABLET | Freq: Every day | ORAL | 3 refills | Status: AC

## 2024-03-16 MED ORDER — SILDENAFIL CITRATE 20 MG PO TABS: 20.0000 mg | ORAL_TABLET | Freq: Every day | ORAL | 11 refills | Status: AC | PRN

## 2024-03-16 NOTE — Progress Notes (Signed)
 SUBJECTIVE:   Chief Complaint  Patient presents with   Medical Management of Chronic Issues    Following up on a mychart message.   HPI Presents for acute visit  Discussed the use of AI scribe software for clinical note transcription with the patient, who gave verbal consent to proceed.  History of Present Illness Anthony Patton. is a 53 year old male with hyperlipidemia and hypertension who presents for discussion of coronary calcium score.  He is considering a coronary calcium score test to assess his cardiovascular risk, influenced by a recent event at work where a colleague had a massive heart attack and a recommendation from his COO. He is concerned about his cardiovascular health due to his family history and is seeking further evaluation.  He has a significant family history of cardiovascular issues. Both parents had cholesterol problems, his grandmother had heart issues possibly requiring a bypass or valve replacement, and his grandfather had two aneurysms and died of lung cancer at age 73 after a history of smoking.  He is currently taking atorvastatin for cholesterol, amlodipine for blood pressure, Xyzal for allergies, Protonix for stomach issues, and sildenafil as needed. He also uses Atarax for anxiety and sleep. He has been on atorvastatin for approximately eight years, initially started due to high cholesterol levels, although he does not recall the exact values that prompted the initiation of treatment.  He recalls a past episode of severe hemorrhoids that required surgical intervention, describing it as extremely painful and requiring pain management post-procedure.  He reports a stressful work environment that occasionally affects his blood pressure.     The 10-year ASCVD risk score (Arnett DK, et al., 2019) is: 5%   PERTINENT PMH / PSH: As above  OBJECTIVE:  BP 124/74   Pulse 70   Temp 98.2 F (36.8 C)   Resp 20   Ht 5' 6.25" (1.683 m)   Wt 155 lb 6 oz  (70.5 kg)   SpO2 97%   BMI 24.89 kg/m    Physical Exam Vitals reviewed.  Constitutional:      General: He is not in acute distress.    Appearance: Normal appearance. He is normal weight. He is not ill-appearing, toxic-appearing or diaphoretic.  Eyes:     General:        Right eye: No discharge.        Left eye: No discharge.  Cardiovascular:     Rate and Rhythm: Normal rate.  Pulmonary:     Effort: Pulmonary effort is normal.  Musculoskeletal:        General: Normal range of motion.     Cervical back: Normal range of motion.  Skin:    General: Skin is warm and dry.  Neurological:     Mental Status: He is alert and oriented to person, place, and time. Mental status is at baseline.  Psychiatric:        Mood and Affect: Mood normal.        Behavior: Behavior normal.        Thought Content: Thought content normal.        Judgment: Judgment normal.           03/16/2024    2:28 PM 08/12/2023    8:34 AM 07/07/2023    1:33 PM 06/15/2023    3:55 PM 06/01/2023    2:12 PM  Depression screen PHQ 2/9  Decreased Interest 0 0 0 0 0  Down, Depressed, Hopeless 0 0 0 0 0  PHQ - 2 Score 0 0 0 0 0  Altered sleeping 0 0 0 0 1  Tired, decreased energy 0 0 0 0 1  Change in appetite 0 0 0 0 0  Feeling bad or failure about yourself  0 0 0 0 0  Trouble concentrating 0 0 0 0 0  Moving slowly or fidgety/restless 0 0 0 0 0  Suicidal thoughts 0 0 0 0 0  PHQ-9 Score 0 0 0 0 2  Difficult doing work/chores  Not difficult at all Not difficult at all Not difficult at all Not difficult at all      03/16/2024    2:28 PM 08/12/2023    8:35 AM 06/15/2023    3:55 PM 06/01/2023    2:12 PM  GAD 7 : Generalized Anxiety Score  Nervous, Anxious, on Edge 0 0 1 1  Control/stop worrying 0 0 0 0  Worry too much - different things 0 0 1 0  Trouble relaxing 0 0 0 1  Restless 0 0 0 0  Easily annoyed or irritable 0 0 0 1  Afraid - awful might happen 0 0 0 0  Total GAD 7 Score 0 0 2 3  Anxiety Difficulty Not  difficult at all Not difficult at all Not difficult at all Not difficult at all    ASSESSMENT/PLAN:  Mixed hyperlipidemia Assessment & Plan: Cholesterol well-managed on atorvastatin with levels around 100 mg/dL. ASCVD risk at 4.4%. Discussed diet and exercise importance. - Refill atorvastatin as prescribed. - Maintains active lifestyle and healthy diet. The 10-year ASCVD risk score (Arnett DK, et al., 2019) is: 5%    Orders: -     Atorvastatin Calcium; Take 1 tablet (10 mg total) by mouth daily at 6 PM.  Dispense: 90 tablet; Refill: 3 -     Lipid panel; Future  Ischemic heart disease Assessment & Plan: Patient requesting Calcium score imaging.  Family history of heart disease and treated for hypertension and hyperlipidemia.  Test may not alter management but could prompt cardiology referral if high.  - Order coronary calcium score test. - Continue atorvastatin as prescribed. - Consider cardiology referral based on test results.  Orders: -     CT CARDIAC SCORING (SELF PAY ONLY)  Essential hypertension Assessment & Plan: Blood pressure controlled on amlodipine. Occasional spikes due to stress. -Refill amlodipine 2.5 mg twice daily. - Advise stress management techniques.    Orders: -     amLODIPine Besylate; Take 1 tablet (2.5 mg total) by mouth 2 (two) times daily.  Dispense: 180 tablet; Refill: 3 -     Comprehensive metabolic panel; Future  Mood disorder [facility]) Assessment & Plan: Chronic.  Denies SI/HI Refill Atarax 25 mg nightly    Orders: -     hydrOXYzine HCl; Take 1-2 tablets (25-50 mg total) by mouth at bedtime as needed. Or BID prn  Dispense: 180 tablet; Refill: 3  Erectile dysfunction, unspecified erectile dysfunction type Assessment & Plan: Refill Sildenafil   Orders: -     Sildenafil Citrate; Take 1-5 tablets (20-100 mg total) by mouth daily as needed.  Dispense: 30 tablet; Refill: 11  Gastroesophageal reflux disease Assessment & Plan: Well-controlled  PPI.  EGD 01/23 shows grade a reflux esophagitis, multiple polyps, gastritis Refill Protonix 20 mg daily Follows with GI at [facility] clinic  Orders: -     Pantoprazole Sodium; TAKE ONE TABLET BY MOUTH DAILY 30 MINUTES BEFORE FOOD  Dispense: 90 tablet; Refill: 3  Prostate cancer screening -  PSA; Future  Allergic rhinitis, unspecified seasonality, unspecified trigger Assessment & Plan: Refill Xyzal  Orders: -     Levocetirizine Dihydrochloride; Take 1 tablet (5 mg total) by mouth every evening.  Dispense: 90 tablet; Refill: 3            PDMP reviewed  Return if symptoms worsen or fail to improve, for PCP.  [name], MD

## 2024-03-16 NOTE — Patient Instructions (Addendum)
 It was a pleasure meeting you today. Thank you for allowing me to take part in your health care.  Our goals for today as we discussed include:  Calcium score/ Cardiac CT.  This is a 3D image of the heart that measures the calcium deposits in the coronary arteries to determine risk of heart disease. It is a $99 self-pay exam . The imaging is done at [facility].   Refills sent for requested medications  Schedule fasting lab.  Fast 10 hours   This is a list of the screening recommended for you and due dates:  Health Maintenance  Topic Date Due   Zoster (Shingles) Vaccine (1 of 2) Never done   COVID-19 Vaccine (6 - 2024-25 season) 08/30/2023   Flu Shot  03/28/2024*   Colon Cancer Screening  12/13/2025   Hepatitis C Screening  Completed   HIV Screening  Completed   HPV Vaccine  Aged Out   DTaP/Tdap/Td vaccine  Discontinued  *Topic was postponed. The date shown is not the original due date.      If you have any questions or concerns, please do not hesitate to call the office at [phone].  I look forward to our next visit and until then take care and stay safe.  Regards,   [name], MD   [facility]

## 2024-03-17 ENCOUNTER — Ambulatory Visit: Admitting: Family Medicine

## 2024-03-23 ENCOUNTER — Encounter: Payer: Self-pay | Admitting: Family Medicine

## 2024-03-23 DIAGNOSIS — Z125 Encounter for screening for malignant neoplasm of prostate: Secondary | ICD-10-CM | POA: Insufficient documentation

## 2024-03-23 NOTE — Assessment & Plan Note (Signed)
 Chronic.  Denies SI/HI Refill Atarax 25 mg nightly

## 2024-03-23 NOTE — Assessment & Plan Note (Signed)
 Blood pressure controlled on amlodipine. Occasional spikes due to stress. -Refill amlodipine 2.5 mg twice daily. - Advise stress management techniques.

## 2024-03-23 NOTE — Assessment & Plan Note (Signed)
 Refill Xyzal

## 2024-03-23 NOTE — Assessment & Plan Note (Signed)
Refill Sildenafil  

## 2024-03-23 NOTE — Assessment & Plan Note (Addendum)
 Well-controlled PPI.  EGD 01/23 shows grade a reflux esophagitis, multiple polyps, gastritis Refill Protonix 20 mg daily Follows with GI at [facility] clinic

## 2024-03-23 NOTE — Assessment & Plan Note (Addendum)
 Cholesterol well-managed on atorvastatin with levels around 100 mg/dL. ASCVD risk at 4.4%. Discussed diet and exercise importance. - Refill atorvastatin as prescribed. - Maintains active lifestyle and healthy diet. The 10-year ASCVD risk score (Arnett DK, et al., 2019) is: 5%

## 2024-03-23 NOTE — Assessment & Plan Note (Signed)
 Patient requesting Calcium score imaging.  Family history of heart disease and treated for hypertension and hyperlipidemia.  Test may not alter management but could prompt cardiology referral if high.  - Order coronary calcium score test. - Continue atorvastatin as prescribed. - Consider cardiology referral based on test results.

## 2024-03-25 ENCOUNTER — Ambulatory Visit
Admission: RE | Admit: 2024-03-25 | Discharge: 2024-03-25 | Disposition: A | Discharge status: Home / Self Care | Payer: Self-pay | Source: Ambulatory Visit | Attending: Family Medicine | Admitting: Family Medicine

## 2024-03-25 DIAGNOSIS — I259 Chronic ischemic heart disease, unspecified: Secondary | ICD-10-CM | POA: Insufficient documentation

## 2024-04-04 ENCOUNTER — Encounter: Payer: Self-pay | Admitting: Family Medicine

## 2024-04-04 ENCOUNTER — Telehealth: Payer: Self-pay

## 2024-04-04 ENCOUNTER — Ambulatory Visit (INDEPENDENT_AMBULATORY_CARE_PROVIDER_SITE_OTHER): Admitting: Family Medicine

## 2024-04-04 VITALS — BP 120/82 | HR 56 | Temp 97.8°F | Resp 20 | Ht 66.25 in | Wt 156.2 lb

## 2024-04-04 DIAGNOSIS — E785 Hyperlipidemia, unspecified: Secondary | ICD-10-CM | POA: Diagnosis not present

## 2024-04-04 DIAGNOSIS — I7121 Aneurysm of the ascending aorta, without rupture: Secondary | ICD-10-CM

## 2024-04-04 DIAGNOSIS — I10 Essential (primary) hypertension: Secondary | ICD-10-CM | POA: Diagnosis not present

## 2024-04-04 DIAGNOSIS — F39 Unspecified mood [affective] disorder: Secondary | ICD-10-CM

## 2024-04-04 NOTE — Patient Instructions (Addendum)
 It was a pleasure meeting you today. Thank you for allowing me to take part in your health care.  Our goals for today as we discussed include:  Keep blood pressure <130/80 Continue current blood pressure medications Continue current statin for cholesterol  Will send referral to vein and Vascular for evaluation.  Recommend management for situational anxiety.  Consider Celexa or Buspar.   This is a list of the screening recommended for you and due dates:  Health Maintenance  Topic Date Due   Pneumococcal Vaccination (1 of 2 - PCV) Never done   Zoster (Shingles) Vaccine (1 of 2) Never done   COVID-19 Vaccine (6 - 2024-25 season) 08/30/2023   Flu Shot  07/29/2024   Colon Cancer Screening  12/13/2025   Hepatitis C Screening  Completed   HIV Screening  Completed   HPV Vaccine  Aged Out   DTaP/Tdap/Td vaccine  Discontinued      If you have any questions or concerns, please do not hesitate to call the office at [phone].  I look forward to our next visit and until then take care and stay safe.  Regards,   [name], MD   [facility]

## 2024-04-04 NOTE — Telephone Encounter (Signed)
 Copied from CRM [id number]. Topic: Clinical - Medical Advice >> Apr 04, 2024 11:52 AM Fredrich Romans wrote: Reason for CRM: Patient would like to now if the appointment today to discuss his CT cardiac results is necessary.He want to know is there a reason  he need to come in and discuss due to him having to pay 200 dollars for his deductible? Because he's already scheduled for  TOC in May.

## 2024-04-04 NOTE — Progress Notes (Signed)
 SUBJECTIVE:   Chief Complaint  Patient presents with   Results    Discuss Ct results   HPI Presents to clinic to discuss recent Calcium  score results.  Discussed the use of AI scribe software for clinical note transcription with the patient, who gave verbal consent to proceed.  History of Present Illness Anthony Patton. is a 53 year old male with aortic dilation who presents for follow-up on cardiac imaging results.  He has a history of aortic dilation identified on recent imaging, which showed a dilation in the ascending aorta measuring 4.1 cm by 3.9 cm. He is concerned about the potential growth of the dilation and its implications. A yearly follow-up has been recommended to monitor the condition.  He experiences high blood pressure at home, with readings reaching the 180s, although his blood pressure was lower during the visit. He attributes the elevated readings to work-related stress and acknowledges that his stress levels may be contributing to his blood pressure issues. He is currently on a statin for cholesterol management. His cardiac score was noted to be in the 44th percentile, with a minor amount of calcium  detected in the left anterior descending artery.  He is currently taking Atarax  for anxiety, which he finds effective but cannot take during the day due to its sedative effects. He is considering other options to manage his stress and anxiety, which he believes are contributing to his elevated blood pressure.  He has a family history of aneurysms, with his grandfather having had a brain aneurysm and another aneurysm in the chest area. His father is also being monitored for an aneurysm, though the specific location is unknown to him at this time.    PERTINENT PMH / PSH: As above  OBJECTIVE:  BP 120/82   Pulse (!) 56   Temp 97.8 F (36.6 C)   Resp 20   Ht 5' 6.25" (1.683 m)   Wt 156 lb 4 oz (70.9 kg)   SpO2 97%   BMI 25.03 kg/m    Physical Exam Vitals  reviewed.  Constitutional:      General: He is not in acute distress.    Appearance: Normal appearance. He is normal weight. He is not ill-appearing, toxic-appearing or diaphoretic.  Eyes:     General:        Right eye: No discharge.        Left eye: No discharge.  Cardiovascular:     Rate and Rhythm: Normal rate.  Pulmonary:     Effort: Pulmonary effort is normal.  Skin:    General: Skin is dry.  Neurological:     Mental Status: He is alert and oriented to person, place, and time. Mental status is at baseline.  Psychiatric:        Mood and Affect: Mood normal.        Behavior: Behavior normal.        Thought Content: Thought content normal.        Judgment: Judgment normal.           04/04/2024    2:09 PM 03/16/2024    2:28 PM 08/12/2023    8:34 AM 07/07/2023    1:33 PM 06/15/2023    3:55 PM  Depression screen PHQ 2/9  Decreased Interest 0 0 0 0 0  Down, Depressed, Hopeless 0 0 0 0 0  PHQ - 2 Score 0 0 0 0 0  Altered sleeping 0 0 0 0 0  Tired, decreased energy 0 0 0  0 0  Change in appetite 0 0 0 0 0  Feeling bad or failure about yourself  0 0 0 0 0  Trouble concentrating 0 0 0 0 0  Moving slowly or fidgety/restless 0 0 0 0 0  Suicidal thoughts 0 0 0 0 0  PHQ-9 Score 0 0 0 0 0  Difficult doing work/chores Not difficult at all  Not difficult at all Not difficult at all Not difficult at all      04/04/2024    2:09 PM 03/16/2024    2:28 PM 08/12/2023    8:35 AM 06/15/2023    3:55 PM  GAD 7 : Generalized Anxiety Score  Nervous, Anxious, on Edge 0 0 0 1  Control/stop worrying 0 0 0 0  Worry too much - different things 0 0 0 1  Trouble relaxing 0 0 0 0  Restless 0 0 0 0  Easily annoyed or irritable 0 0 0 0  Afraid - awful might happen 0 0 0 0  Total GAD 7 Score 0 0 0 2  Anxiety Difficulty Not difficult at all Not difficult at all Not difficult at all Not difficult at all    ASSESSMENT/PLAN:  Aneurysm of ascending aorta without rupture (HCC) Assessment & Plan: 4.1 cm  by 3.9 cm dilation in the ascending aorta. Possible hereditary component. Stress and hypertension are risk factors for growth. Medical management recommended for aneurysms <4 cm. - Refer to vascular surgery for evaluation and management. - Monitor blood pressure <130/80 mmHg. Continue current blood pressure medications. - Recommend stress management techniques, including meditation and therapy.   Orders: -     Ambulatory referral to Vascular Surgery  Essential hypertension Assessment & Plan: Inconsistent blood pressure readings. Stress contributes to elevated home readings. Effective stress management crucial for control. - Continue Amlodipine  2.5 mg BID - Start Hydrochlorothiazide  12.5 mg daily - Monitor blood pressure regularly and report high readings.    Mood disorder [facility]) Assessment & Plan: Chronic.  Denies SI/HI Refill Atarax  25 mg nightly - Start Buspar  5 mg BID  for anxiety. - Encourage lifestyle modifications to reduce stress, such as meditation and therapy.    Hyperlipidemia, unspecified hyperlipidemia type Assessment & Plan: Recent Calcium  Score 0, given that was on statin would continue current medication - Continue atorvastatin  20 mg daily - Maintains active lifestyle and healthy diet.      PDMP reviewed  Return if symptoms worsen or fail to improve, for PCP.  Valli Gaw, MD

## 2024-04-05 ENCOUNTER — Encounter: Payer: Self-pay | Admitting: Family Medicine

## 2024-04-05 NOTE — Telephone Encounter (Signed)
 Pt seen in office 04/04/2024 to discuss.

## 2024-04-05 NOTE — Telephone Encounter (Unsigned)
 Copied from CRM [id number]. Topic: General - Call Back - No Documentation >> Apr 05, 2024  1:35 PM Shereese L wrote: Reason for CRM: patient would like a call back with the vascular doctors info so that he can reach out and make an appt

## 2024-04-07 NOTE — Telephone Encounter (Signed)
 Copied from CRM [id number]. Topic: General - Call Back - No Documentation >> Apr 05, 2024  1:35 PM Shereese L wrote: Reason for CRM: patient would like a call back with the vascular doctors info so that he can reach out and make an appt

## 2024-04-08 ENCOUNTER — Other Ambulatory Visit: Payer: Self-pay | Admitting: Family Medicine

## 2024-04-08 ENCOUNTER — Telehealth: Payer: Self-pay | Admitting: Family Medicine

## 2024-04-08 DIAGNOSIS — F39 Unspecified mood [affective] disorder: Secondary | ICD-10-CM

## 2024-04-08 DIAGNOSIS — I10 Essential (primary) hypertension: Secondary | ICD-10-CM

## 2024-04-08 MED ORDER — HYDROCHLOROTHIAZIDE 12.5 MG PO TABS: 12.5000 mg | ORAL_TABLET | Freq: Every day | ORAL | 1 refills | Status: DC

## 2024-04-08 MED ORDER — BUSPIRONE HCL 5 MG PO TABS: 5.0000 mg | ORAL_TABLET | Freq: Two times a day (BID) | ORAL | 1 refills | Status: DC

## 2024-04-08 NOTE — Telephone Encounter (Signed)
 Copied from CRM [id number]. Topic: Clinical - Prescription Issue >> Apr 08, 2024  4:28 PM Denese Killings wrote: Reason for CRM: Anthony Patton with [employer] has questions regarding hydrochlorothiazide (HYDRODIURIL) 12.5 MG tablet. She states that it is also listed under his allergies and wants to know if it is intentional or why is he being prescribed it.

## 2024-04-08 NOTE — Telephone Encounter (Signed)
 Patient has been responded to via MyChart encounter.

## 2024-04-12 ENCOUNTER — Ambulatory Visit (INDEPENDENT_AMBULATORY_CARE_PROVIDER_SITE_OTHER): Admitting: Vascular Surgery

## 2024-04-12 ENCOUNTER — Encounter (INDEPENDENT_AMBULATORY_CARE_PROVIDER_SITE_OTHER): Payer: Self-pay | Admitting: Vascular Surgery

## 2024-04-12 VITALS — BP 134/90 | HR 67 | Resp 16 | Ht 66.0 in | Wt 155.0 lb

## 2024-04-12 DIAGNOSIS — E785 Hyperlipidemia, unspecified: Secondary | ICD-10-CM | POA: Diagnosis not present

## 2024-04-12 DIAGNOSIS — I7121 Aneurysm of the ascending aorta, without rupture: Secondary | ICD-10-CM | POA: Insufficient documentation

## 2024-04-12 DIAGNOSIS — I10 Essential (primary) hypertension: Secondary | ICD-10-CM | POA: Diagnosis not present

## 2024-04-12 NOTE — Assessment & Plan Note (Signed)
 Control is not been optimal and this is an independent risk factor for growth of his aneurysm.  A renal artery duplex is going to be done to evaluate for renal artery stenosis as a cause of his hypertension.  Getting this under better control is going to be very important to reduce the risk of aneurysmal growth.  Referral to cardiology placed.

## 2024-04-12 NOTE — Assessment & Plan Note (Signed)
 lipid control important in reducing the progression of atherosclerotic disease. Continue statin therapy

## 2024-04-12 NOTE — Assessment & Plan Note (Signed)
 I have independently reviewed the CT scan.  The aorta was obviously not the primary focus of the scan, but on this limited study his ascending thoracic aorta measured 4.1 cm x 3.8 cm in maximal diameter.  At this size, it does not pose any obvious threat to the patient.  This can be followed on an annual basis with CT scan of the chest.  I have discussed the pathophysiology and natural history of ascending thoracic aortic aneurysm.  This is almost certainly from his longstanding poorly controlled hypertension.  We are going to evaluate that with a renal duplex.  Getting his blood pressure under control is of paramount importance and he asked for a referral to [facility] clinic cardiology so we will provide this.

## 2024-04-12 NOTE — Progress Notes (Signed)
 Patient ID: Anthony Patton., male   DOB: [birth year], 53 y.o.   MRN: [id number]  Chief Complaint  Patient presents with   New Patient (Initial Visit)    np. consult. CT scoring done 03/29/24. Incidental aneurysmal dialtion of ascending aorta measuring 4.1 x3.8 cm proximally and 3.9 x3.7 cm distally  ref: [name]    HPI Anthony Patton. is a 53 y.o. male.  I am asked to see the patient by [name] for evaluation of an ascending thoracic aortic aneurysm seen as an incidental finding on the CT scan of the chest done for cardiac scoring.  I have independently reviewed the CT scan.  The aorta was obviously not the primary focus of the scan, but on this limited study his ascending thoracic aorta measured 4.1 cm x 3.8 cm in maximal diameter.  As this was above 4 cm, this is technically aneurysmal degeneration and he is referred for further evaluation and treatment.  The patient reports longstanding hypertension which has been difficult to control.  He has had hypertension for roughly a decade.  He does not smoke or use any tobacco products.  No known family history of aneurysm although it sounds like there were some atherosclerotic disease present.     Past Medical History:  Diagnosis Date   Allergy    Anxiety    Arthritis    Benign cyst of kidney    [facility] 04/11/22 4.8 cm [facility]   Chicken pox    Chronic gastritis    egd [facility] gi Dr. [name] 01/02/22 with fundic gastric polyps   Colon polyps    Fundic gland polyps of stomach, benign    Gastritis    with esophagitis    GERD (gastroesophageal reflux disease)    Hiatal hernia    small noted egd 01/02/22 [facility] Dr. [name]   Hyperlipidemia    Hypertension    Liver cyst    04/11/22 [facility] ct ab/pelvis   Low back pain     Past Surgical History:  Procedure Laterality Date   APPENDECTOMY     COLONOSCOPY W/ POLYPECTOMY     ESOPHAGOGASTRODUODENOSCOPY     01/02/22 mild chronic gastritis and fundic gland plyps f/u prn neg  H pylori Dr. [name]   EYE SURGERY  Lasik   KNEE SURGERY     arthroscopy x 2 torn cartilage    LASIK     b/l   TONSILLECTOMY       Family History  Problem Relation Age of Onset   Diabetes Mother    Hypertension Mother    Heart attack Mother        mid 69s   Diabetes Father    Hypertension Father    Hyperlipidemia Father    Colon polyps Father        precancerous    Diabetes Maternal Grandmother    Heart disease Maternal Grandfather        MI age 7s-50s    Hyperlipidemia Paternal Grandmother    Valvular heart disease Paternal Grandmother    Diabetes Paternal Grandfather    Cancer Paternal Grandfather        lung cancer smoker    Heart attack Paternal Grandfather        in age 33s       Social History   Tobacco Use   Smoking status: Never   Smokeless tobacco: Never  Substance Use Topics   Alcohol use: Yes    Alcohol/week: 2.0 standard  drinks of alcohol    Types: 2 Glasses of wine per week   Drug use: Never     Allergies  Allergen Reactions   Hctz [Hydrochlorothiazide]     Dizziness     Penicillins Rash    Rash as chid but able to tolerate amoxicillin     Current Outpatient Medications  Medication Sig Dispense Refill   amLODipine (NORVASC) 2.5 MG tablet Take 1 tablet (2.5 mg total) by mouth 2 (two) times daily. 180 tablet 3   atorvastatin (LIPITOR) 10 MG tablet Take 1 tablet (10 mg total) by mouth daily at 6 PM. 90 tablet 3   busPIRone (BUSPAR) 5 MG tablet Take 1 tablet (5 mg total) by mouth 2 (two) times daily. 60 tablet 1   dibucaine (NUPERCAINAL) 1 % OINT Place 1 Application rectally as needed for hemorrhoids. 28 g 1   hydrochlorothiazide (HYDRODIURIL) 12.5 MG tablet Take 1 tablet (12.5 mg total) by mouth daily. 30 tablet 1   hydrocortisone (ANUSOL-HC) 2.5 % rectal cream Place 1 Application rectally 2 (two) times daily. 30 g 0   hydrOXYzine (ATARAX) 25 MG tablet Take 1-2 tablets (25-50 mg total) by mouth at bedtime as needed. Or BID prn 180  tablet 3   levocetirizine (XYZAL) 5 MG tablet Take 1 tablet (5 mg total) by mouth every evening. 90 tablet 3   Multiple Vitamin (MULTIVITAMIN) capsule Take 1 capsule by mouth daily.     pantoprazole (PROTONIX) 20 MG tablet TAKE ONE TABLET BY MOUTH DAILY 30 MINUTES BEFORE FOOD 90 tablet 3   sildenafil (REVATIO) 20 MG tablet Take 1-5 tablets (20-100 mg total) by mouth daily as needed. 30 tablet 11   No current facility-administered medications for this visit.      REVIEW OF SYSTEMS (Negative unless checked)  Constitutional: [] Weight loss  [] Fever  [] Chills Cardiac: [] Chest pain   [] Chest pressure   [] Palpitations   [] Shortness of breath when laying flat   [] Shortness of breath at rest   [] Shortness of breath with exertion. Vascular:  [] Pain in legs with walking   [] Pain in legs at rest   [] Pain in legs when laying flat   [] Claudication   [] Pain in feet when walking  [] Pain in feet at rest  [] Pain in feet when laying flat   [] History of DVT   [] Phlebitis   [] Swelling in legs   [] Varicose veins   [] Non-healing ulcers Pulmonary:   [] Uses home oxygen   [] Productive cough   [] Hemoptysis   [] Wheeze  [] COPD   [] Asthma Neurologic:  [] Dizziness  [] Blackouts   [] Seizures   [] History of stroke   [] History of TIA  [] Aphasia   [] Temporary blindness   [] Dysphagia   [] Weakness or numbness in arms   [] Weakness or numbness in legs Musculoskeletal:  [x] Arthritis   [] Joint swelling   [] Joint pain   [x] Low back pain Hematologic:  [] Easy bruising  [] Easy bleeding   [] Hypercoagulable state   [] Anemic  [] Hepatitis Gastrointestinal:  [] Blood in stool   [] Vomiting blood  [x] Gastroesophageal reflux/heartburn   [] Abdominal pain Genitourinary:  [] Chronic kidney disease   [] Difficult urination  [] Frequent urination  [] Burning with urination   [] Hematuria Skin:  [] Rashes   [] Ulcers   [] Wounds Psychological:  [x] History of anxiety   []  History of major depression.    Physical Exam BP (!) 134/90   Pulse 67   Resp 16    Ht 5\' 6"  (1.676 m)   Wt 155 lb (70.3 kg)   BMI 25.02 kg/m  Gen:  WD/WN, NAD. Appears younger than stated age. Head: [place]/AT, No temporalis wasting.  Ear/Nose/Throat: Hearing grossly intact, nares w/o erythema or drainage, oropharynx w/o Erythema/Exudate Eyes: Conjunctiva clear, sclera non-icteric  Neck: trachea midline.  No JVD.  Pulmonary:  Good air movement, respirations not labored, no use of accessory muscles  Cardiac: RRR, no JVD Vascular:  Vessel Right Left  Radial Palpable Palpable                                   Gastrointestinal:. No masses, surgical incisions, or scars. Musculoskeletal: M/S 5/5 throughout.  Extremities without ischemic changes.  No deformity or atrophy. No edema. Neurologic: Sensation grossly intact in extremities.  Symmetrical.  Speech is fluent. Motor exam as listed above. Psychiatric: Judgment intact, Mood & affect appropriate for pt's clinical situation. Dermatologic: No rashes or ulcers noted.  No cellulitis or open wounds.    Radiology CT CARDIAC SCORING (SELF PAY ONLY) Addendum Date: 03/29/2024 ADDENDUM REPORT: 03/29/2024 10:01 ADDENDUM: OVER-READ INTERPRETATION  CT CHEST The following report is an over-read performed by radiologist Dr. Rowe [facility] on 03/18/2024. This over-read does not include interpretation of cardiac or coronary anatomy or pathology. The interpretation by the cardiologist is attached. COMPARISON:  None. FINDINGS: Within the visualized portions of the lungs demonstrates no acute infiltrates consolidations. No pulmonary nodules or masses. No pleural effusions. No mediastinal masses or adenopathy. No pericardial effusions. Aorta appears normal caliber. No acute findings in the upper abdomen. Chest wall and soft tissues are unremarkable without evidence of acute bony abnormalities. Aneurysmal dilatation of the ascending aorta measuring 4.1 x 3.8 cm proximally and 3.9 x 3.7 cm distally. Yearly follow-up  recommended IMPRESSION: Aneurysmal dilatation of the ascending aorta measuring 4.1 x 3.8 cm proximally and 3.9 x 3.7 cm distally. Yearly follow-up recommended. Electronically Signed   By: Shaaron Adler M.D.   On: 03/29/2024 10:01   Result Date: 03/29/2024 CLINICAL DATA:  Risk stratification EXAM: Coronary Calcium Score TECHNIQUE: The patient was scanned on a Siemens Somatom scanner. Axial non-contrast 3 mm slices were carried out through the heart. The data set was analyzed on a dedicated work station and scored using the Agatson method. FINDINGS: Non-cardiac: See separate report from [facility]. Ascending Aorta: Normal size Pericardium: Normal Coronary arteries: Normal origin of left and right coronary arteries. Distribution of arterial calcifications if present, as noted below; LM 0 LAD 1.51 LCx 0 RCA 0 Total 1.51 IMPRESSION AND RECOMMENDATION: 1. Coronary calcium score of 1.51. This was 44th percentile for age and sex matched control. 2. CAC 1-99 in LAD. CAC-DRS A1/N1. 3. Continue heart healthy lifestyle and risk factor modification. Electronically Signed: By: Debbe Odea M.D. On: 03/25/2024 13:39    Labs No results found for this or any previous visit (from the past 2160 hours).  Assessment/Plan:  Thoracic ascending aortic aneurysm (HCC) I have independently reviewed the CT scan.  The aorta was obviously not the primary focus of the scan, but on this limited study his ascending thoracic aorta measured 4.1 cm x 3.8 cm in maximal diameter.  At this size, it does not pose any obvious threat to the patient.  This can be followed on an annual basis with CT scan of the chest.  I have discussed the pathophysiology and natural history of ascending thoracic aortic aneurysm.  This is almost certainly from his longstanding poorly controlled hypertension.  We are going to evaluate  that with a renal duplex.  Getting his blood pressure under control is of paramount importance and he asked for a  referral to [facility] clinic cardiology so we will provide this.  Essential hypertension Control is not been optimal and this is an independent risk factor for growth of his aneurysm.  A renal artery duplex is going to be done to evaluate for renal artery stenosis as a cause of his hypertension.  Getting this under better control is going to be very important to reduce the risk of aneurysmal growth.  Referral to cardiology placed.  Hyperlipidemia lipid control important in reducing the progression of atherosclerotic disease. Continue statin therapy      Mikki Alexander 04/12/2024, 4:41 PM   This note was created with Dragon medical transcription system.  Any errors from dictation are unintentional.

## 2024-04-17 ENCOUNTER — Encounter: Payer: Self-pay | Admitting: Family Medicine

## 2024-04-17 NOTE — Assessment & Plan Note (Signed)
 Chronic.  Denies SI/HI Refill Atarax  25 mg nightly - Start Buspar  5 mg BID  for anxiety. - Encourage lifestyle modifications to reduce stress, such as meditation and therapy.

## 2024-04-17 NOTE — Assessment & Plan Note (Signed)
 4.1 cm by 3.9 cm dilation in the ascending aorta. Possible hereditary component. Stress and hypertension are risk factors for growth. Medical management recommended for aneurysms <4 cm. - Refer to vascular surgery for evaluation and management. - Monitor blood pressure <130/80 mmHg. Continue current blood pressure medications. - Recommend stress management techniques, including meditation and therapy.

## 2024-04-17 NOTE — Assessment & Plan Note (Signed)
 Recent Calcium  Score 0, given that was on statin would continue current medication - Continue atorvastatin  20 mg daily - Maintains active lifestyle and healthy diet.

## 2024-04-17 NOTE — Assessment & Plan Note (Signed)
 Inconsistent blood pressure readings. Stress contributes to elevated home readings. Effective stress management crucial for control. - Continue Amlodipine  2.5 mg BID - Start Hydrochlorothiazide  12.5 mg daily - Monitor blood pressure regularly and report high readings.

## 2024-04-21 ENCOUNTER — Encounter (INDEPENDENT_AMBULATORY_CARE_PROVIDER_SITE_OTHER): Payer: Self-pay | Admitting: Vascular Surgery

## 2024-04-22 ENCOUNTER — Other Ambulatory Visit (INDEPENDENT_AMBULATORY_CARE_PROVIDER_SITE_OTHER): Payer: Self-pay | Admitting: Nurse Practitioner

## 2024-04-22 DIAGNOSIS — I7121 Aneurysm of the ascending aorta, without rupture: Secondary | ICD-10-CM

## 2024-04-22 NOTE — Telephone Encounter (Signed)
 Referral placed.

## 2024-05-03 ENCOUNTER — Encounter (INDEPENDENT_AMBULATORY_CARE_PROVIDER_SITE_OTHER): Admitting: Vascular Surgery

## 2024-05-17 ENCOUNTER — Encounter (INDEPENDENT_AMBULATORY_CARE_PROVIDER_SITE_OTHER): Payer: Self-pay

## 2024-05-19 ENCOUNTER — Other Ambulatory Visit (INDEPENDENT_AMBULATORY_CARE_PROVIDER_SITE_OTHER)

## 2024-05-19 ENCOUNTER — Ambulatory Visit: Payer: Self-pay | Admitting: Family Medicine

## 2024-05-19 DIAGNOSIS — Z125 Encounter for screening for malignant neoplasm of prostate: Secondary | ICD-10-CM

## 2024-05-19 DIAGNOSIS — E782 Mixed hyperlipidemia: Secondary | ICD-10-CM

## 2024-05-19 DIAGNOSIS — I10 Essential (primary) hypertension: Secondary | ICD-10-CM | POA: Diagnosis not present

## 2024-05-19 LAB — COMPREHENSIVE METABOLIC PANEL WITH GFR
ALT: 27 U/L (ref 0–53)
AST: 22 U/L (ref 0–37)
Albumin: 4.9 g/dL (ref 3.5–5.2)
Alkaline Phosphatase: 89 U/L (ref 39–117)
BUN: 23 mg/dL (ref 6–23)
CO2: 33 meq/L — ABNORMAL HIGH (ref 19–32)
Calcium: 9.8 mg/dL (ref 8.4–10.5)
Chloride: 103 meq/L (ref 96–112)
Creatinine, Ser: 1.15 mg/dL (ref 0.40–1.50)
GFR: 71.77 mL/min (ref >60.00)
Glucose, Bld: 95 mg/dL (ref 70–99)
Potassium: 4.3 meq/L (ref 3.5–5.1)
Sodium: 142 meq/L (ref 135–145)
Total Bilirubin: 0.9 mg/dL (ref 0.2–1.2)
Total Protein: 7 g/dL (ref 6.0–8.3)

## 2024-05-19 LAB — LIPID PANEL
Cholesterol: 156 mg/dL (ref 0–200)
HDL: 49.8 mg/dL (ref >39.00)
LDL Cholesterol: 89 mg/dL (ref 0–99)
NonHDL: 106.23
Total CHOL/HDL Ratio: 3
Triglycerides: 86 mg/dL (ref 0.0–149.0)
VLDL: 17.2 mg/dL (ref 0.0–40.0)

## 2024-05-19 LAB — PSA: PSA: 0.95 ng/mL (ref 0.10–4.00)

## 2024-05-25 ENCOUNTER — Ambulatory Visit (INDEPENDENT_AMBULATORY_CARE_PROVIDER_SITE_OTHER)

## 2024-05-25 DIAGNOSIS — I10 Essential (primary) hypertension: Secondary | ICD-10-CM

## 2024-05-26 ENCOUNTER — Encounter: Payer: Self-pay | Admitting: Internal Medicine

## 2024-05-26 ENCOUNTER — Ambulatory Visit: Admitting: Internal Medicine

## 2024-05-26 VITALS — BP 120/72 | HR 67 | Ht 66.0 in | Wt 156.6 lb

## 2024-05-26 DIAGNOSIS — G4733 Obstructive sleep apnea (adult) (pediatric): Secondary | ICD-10-CM

## 2024-05-26 DIAGNOSIS — F419 Anxiety disorder, unspecified: Secondary | ICD-10-CM | POA: Diagnosis not present

## 2024-05-26 DIAGNOSIS — E785 Hyperlipidemia, unspecified: Secondary | ICD-10-CM

## 2024-05-26 DIAGNOSIS — I10 Essential (primary) hypertension: Secondary | ICD-10-CM | POA: Diagnosis not present

## 2024-05-26 DIAGNOSIS — F32A Depression, unspecified: Secondary | ICD-10-CM

## 2024-05-26 LAB — MICROALBUMIN / CREATININE URINE RATIO
Creatinine,U: 26.6 mg/dL
Microalb Creat Ratio: UNDETERMINED mg/g (ref 0.0–30.0)
Microalb, Ur: <0.7 mg/dL

## 2024-05-26 NOTE — Patient Instructions (Signed)
 Good to meet you!     No medication changes today unless your urine test is positive for protein .  Will see you in August for CPE

## 2024-05-26 NOTE — Assessment & Plan Note (Signed)
-   Patient has symptoms of loud snoring, restless sleep and daytime sleepiness.  Home sleep study on 06/11/2022 showed moderate obstructive sleep apnea, AHI 20.7 an hour.  Has not tolerated CPAP trial . Now using a chin strap and snoring has improved.

## 2024-05-26 NOTE — Assessment & Plan Note (Signed)
 Previous trials of meds by Dr Mitzie Anda made him feel sluggish.    Has reduced buspirone  due to difficultly sleeping with evening dose.  Now taking 5 mg daily in the morning. Encouraged to exercise to relieve tension . Likes to run for exercise byt left knee and lower back issues prevent him.

## 2024-05-26 NOTE — Progress Notes (Signed)
 Copied from CRM [id number]. Topic: Clinical - Prescription Issue >> Apr 08, 2024  4:28 PM Chuck Crater wrote: Reason for CRM: Tristian with [employer] has questions regarding hydrochlorothiazide  (HYDRODIURIL ) 12.5 MG tablet. She states that it is also listed under his allergies and wants to know if it is intentional or why is he being prescribed it.  Subjective:  Patient ID: Anthony Patton., male    DOB: [birth year]  Age: 53 y.o. MRN: [id number]  CC: The primary encounter diagnosis was Anxiety and depression. Diagnoses of Obstructive sleep apnea syndrome, Essential hypertension, Benign essential HTN, and Hyperlipidemia, unspecified hyperlipidemia type were also pertinent to this visit.   HPI Anthony Patton. presents for  Chief Complaint  Patient presents with  . Transfer of Care    1) History of labile HTN:  currently taking amlodipine   2.5 mg bid and hydrochlorothiazide  12.5 mg daily.   remote use of losatan/hct during residence IN [place].   Moved to [place] ,  hydrochlorothiazide  stopped , amlodipine  added (2.5 mg bid)  and losartan  stopped  due to recurrent drops in BP in the evenings after exercise and shower.  Currently taking amlodipine  and hydrochlorothiazide  and losartan    2) GAD:  Work stress   using buspirone    Outpatient Medications Prior to Visit  Medication Sig Dispense Refill  . amLODipine  (NORVASC ) 2.5 MG tablet Take 1 tablet (2.5 mg total) by mouth 2 (two) times daily. 180 tablet 3  . atorvastatin  (LIPITOR) 10 MG tablet Take 1 tablet (10 mg total) by mouth daily at 6 PM. 90 tablet 3  . busPIRone  (BUSPAR ) 5 MG tablet Take 1 tablet (5 mg total) by mouth 2 (two) times daily. (Patient taking differently: Take 5 mg by mouth daily.) 60 tablet 1  . hydrochlorothiazide  (HYDRODIURIL ) 12.5 MG tablet Take 1 tablet (12.5 mg total) by mouth daily. 30 tablet 1  . levocetirizine (XYZAL ) 5 MG tablet Take 1 tablet (5 mg total) by mouth every evening. 90 tablet 3  . Multiple Vitamin (MULTIVITAMIN)  capsule Take 1 capsule by mouth daily.    . pantoprazole  (PROTONIX ) 20 MG tablet TAKE ONE TABLET BY MOUTH DAILY 30 MINUTES BEFORE FOOD 90 tablet 3  . sildenafil  (REVATIO ) 20 MG tablet Take 1-5 tablets (20-100 mg total) by mouth daily as needed. 30 tablet 11  . dibucaine (NUPERCAINAL) 1 % OINT Place 1 Application rectally as needed for hemorrhoids. 28 g 1  . hydrocortisone  (ANUSOL -HC) 2.5 % rectal cream Place 1 Application rectally 2 (two) times daily. 30 g 0  . hydrOXYzine  (ATARAX ) 25 MG tablet Take 1-2 tablets (25-50 mg total) by mouth at bedtime as needed. Or BID prn (Patient not taking: Reported on 05/26/2024) 180 tablet 3   No facility-administered medications prior to visit.    Review of Systems;  Patient denies headache, fevers, malaise, unintentional weight loss, skin rash, eye pain, sinus congestion and sinus pain, sore throat, dysphagia,  hemoptysis , cough, dyspnea, wheezing, chest pain, palpitations, orthopnea, edema, abdominal pain, nausea, melena, diarrhea, constipation, flank pain, dysuria, hematuria, urinary  Frequency, nocturia, numbness, tingling, seizures,  Focal weakness, Loss of consciousness,  Tremor, insomnia, depression, anxiety, and suicidal ideation.      Objective:  BP 120/72   Pulse 67   Ht 5\' 6"  (1.676 m)   Wt 156 lb 9.6 oz (71 kg)   SpO2 97%   BMI 25.28 kg/m   BP Readings from Last 3 Encounters:  05/27/24 127/82  05/26/24 120/72  04/12/24 (!) 134/90    [employer]  Readings from Last 3 Encounters:  05/27/24 154 lb 9.6 oz (70.1 kg)  05/26/24 156 lb 9.6 oz (71 kg)  04/12/24 155 lb (70.3 kg)    Physical Exam Vitals reviewed.  Constitutional:      General: He is not in acute distress.    Appearance: Normal appearance. He is normal weight. He is not ill-appearing, toxic-appearing or diaphoretic.  HENT:     Head: Normocephalic.  Eyes:     General: No scleral icterus.       Right eye: No discharge.        Left eye: No discharge.     Conjunctiva/sclera:  Conjunctivae normal.  Cardiovascular:     Rate and Rhythm: Normal rate and regular rhythm.     Heart sounds: Normal heart sounds.  Pulmonary:     Effort: Pulmonary effort is normal. No respiratory distress.     Breath sounds: Normal breath sounds.  Musculoskeletal:        General: Normal range of motion.     Cervical back: Normal range of motion.  Skin:    General: Skin is warm and dry.  Neurological:     General: No focal deficit present.     Mental Status: He is alert and oriented to person, place, and time. Mental status is at baseline.  Psychiatric:        Mood and Affect: Mood normal.        Behavior: Behavior normal.        Thought Content: Thought content normal.        Judgment: Judgment normal.   Lab Results  Component Value Date   HGBA1C 5.5 06/11/2023    Lab Results  Component Value Date   CREATININE 1.15 05/19/2024   CREATININE 1.13 06/11/2023   CREATININE 1.09 03/20/2022    Lab Results  Component Value Date   WBC 5.3 06/11/2023   HGB 15.4 06/11/2023   HCT 45.8 06/11/2023   PLT 185.0 06/11/2023   GLUCOSE 95 05/19/2024   CHOL 156 05/19/2024   TRIG 86.0 05/19/2024   HDL 49.80 05/19/2024   LDLCALC 89 05/19/2024   ALT 27 05/19/2024   AST 22 05/19/2024   NA 142 05/19/2024   K 4.3 05/19/2024   CL 103 05/19/2024   CREATININE 1.15 05/19/2024   BUN 23 05/19/2024   CO2 33 (H) 05/19/2024   TSH 0.84 03/20/2022   PSA 0.95 05/19/2024   HGBA1C 5.5 06/11/2023   MICROALBUR <0.7 05/26/2024    CT CARDIAC SCORING (SELF PAY ONLY) Addendum Date: 03/29/2024 ADDENDUM REPORT: 03/29/2024 10:01 ADDENDUM: OVER-READ INTERPRETATION  CT CHEST The following report is an over-read performed by radiologist Dr. Avanell Bob [facility] on 03/18/2024. This over-read does not include interpretation of cardiac or coronary anatomy or pathology. The interpretation by the cardiologist is attached. COMPARISON:  None. FINDINGS: Within the visualized portions of the lungs  demonstrates no acute infiltrates consolidations. No pulmonary nodules or masses. No pleural effusions. No mediastinal masses or adenopathy. No pericardial effusions. Aorta appears normal caliber. No acute findings in the upper abdomen. Chest wall and soft tissues are unremarkable without evidence of acute bony abnormalities. Aneurysmal dilatation of the ascending aorta measuring 4.1 x 3.8 cm proximally and 3.9 x 3.7 cm distally. Yearly follow-up recommended IMPRESSION: Aneurysmal dilatation of the ascending aorta measuring 4.1 x 3.8 cm proximally and 3.9 x 3.7 cm distally. Yearly follow-up recommended. Electronically Signed   By: Fredrich Jefferson M.D.   On: 03/29/2024 10:01   Result Date:  03/29/2024 CLINICAL DATA:  Risk stratification EXAM: Coronary Calcium  Score TECHNIQUE: The patient was scanned on a Siemens Somatom scanner. Axial non-contrast 3 mm slices were carried out through the heart. The data set was analyzed on a dedicated work station and scored using the Agatson method. FINDINGS: Non-cardiac: See separate report from [facility]. Ascending Aorta: Normal size Pericardium: Normal Coronary arteries: Normal origin of left and right coronary arteries. Distribution of arterial calcifications if present, as noted below; LM 0 LAD 1.51 LCx 0 RCA 0 Total 1.51 IMPRESSION AND RECOMMENDATION: 1. Coronary calcium  score of 1.51. This was 44th percentile for age and sex matched control. 2. CAC 1-99 in LAD. CAC-DRS A1/N1. 3. Continue heart healthy lifestyle and risk factor modification. Electronically Signed: By: Constancia Delton M.D. On: 03/25/2024 13:39    Assessment & Plan:  .Anxiety and depression Assessment & Plan: Previous trials of meds by Dr Mitzie Anda made him feel sluggish.    Has reduced buspirone  due to difficultly sleeping with evening dose.  Now taking 5 mg daily in the morning. Encouraged to exercise to relieve tension . Likes to run for exercise byt left knee and lower back issues prevent him.     Obstructive sleep apnea syndrome Assessment & Plan: - Patient has symptoms of loud snoring, restless sleep and daytime sleepiness.  Home sleep study on 06/11/2022 showed moderate obstructive sleep apnea, AHI 20.7 an hour.  Has not tolerated CPAP trial . Now using a chin strap and snoring has improved.      Essential hypertension -     Microalbumin / creatinine urine ratio  Benign essential HTN Assessment & Plan: Currently managed with hydrochlorothiazide  and amlodipine . He has no proteinuria.  No changes today   Lab Results  Component Value Date   LABMICR <3.0 03/19/2021   LABMICR Comment 03/12/2021   MICROALBUR <0.7 05/26/2024     Lab Results  Component Value Date   NA 142 05/19/2024   K 4.3 05/19/2024   CL 103 05/19/2024   CO2 33 (H) 05/19/2024      Hyperlipidemia, unspecified hyperlipidemia type Assessment & Plan: Managed with atorvastatin  given elevated CAC score   Lab Results  Component Value Date   CHOL 156 05/19/2024   HDL 49.80 05/19/2024   LDLCALC 89 05/19/2024   TRIG 86.0 05/19/2024   CHOLHDL 3 05/19/2024         I spent 34 minutes on the day of this face to face encounter reviewing patient's  most recent visit with cardiology,  ,  prior relevant surgical and non surgical procedures, recent  labs and imaging studies, counseling on weight management,  reviewing the assessment and plan with patient, and post visit ordering and reviewing of  diagnostics and therapeutics with patient  .   Follow-up: Return for physical.   Thersia Flax, MD

## 2024-05-27 ENCOUNTER — Ambulatory Visit (INDEPENDENT_AMBULATORY_CARE_PROVIDER_SITE_OTHER): Admitting: Vascular Surgery

## 2024-05-27 VITALS — BP 127/82 | HR 62 | Resp 18 | Wt 154.6 lb

## 2024-05-27 DIAGNOSIS — I7121 Aneurysm of the ascending aorta, without rupture: Secondary | ICD-10-CM | POA: Diagnosis not present

## 2024-05-27 DIAGNOSIS — E785 Hyperlipidemia, unspecified: Secondary | ICD-10-CM

## 2024-05-27 DIAGNOSIS — I10 Essential (primary) hypertension: Secondary | ICD-10-CM | POA: Diagnosis not present

## 2024-05-27 NOTE — Assessment & Plan Note (Signed)
 Check annually with CT scan.

## 2024-05-27 NOTE — Assessment & Plan Note (Signed)
 lipid control important in reducing the progression of atherosclerotic disease. Continue statin therapy

## 2024-05-27 NOTE — Assessment & Plan Note (Signed)
 A recent renal artery duplex demonstrated no evidence of significant renal artery stenosis in either kidney.  He has been referred to cardiology for further evaluation of his refractory hypertension as well.  Control of his blood pressure is important for reducing the risk of aneurysmal degeneration in the future.

## 2024-05-27 NOTE — Progress Notes (Signed)
 MRN : [id number]  Anthony Patton. is a 53 y.o. (March 07, 1971) male who presents with chief complaint of  Chief Complaint  Patient presents with   Follow-up    Pt conv renal duplex  .  History of Present Illness: Patient returns today in follow up of thoracic aortic aneurysm and hypertension.  He has seen a cardiologist who has gotten his blood pressure under better control with a combination of Norvasc  and hydrochlorothiazide .  A renal artery duplex was done earlier this week demonstrating no evidence of renal artery stenosis in either renal artery.  Current Outpatient Medications  Medication Sig Dispense Refill   amLODipine  (NORVASC ) 2.5 MG tablet Take 1 tablet (2.5 mg total) by mouth 2 (two) times daily. 180 tablet 3   atorvastatin  (LIPITOR) 10 MG tablet Take 1 tablet (10 mg total) by mouth daily at 6 PM. 90 tablet 3   busPIRone  (BUSPAR ) 5 MG tablet Take 1 tablet (5 mg total) by mouth 2 (two) times daily. (Patient taking differently: Take 5 mg by mouth daily.) 60 tablet 1   hydrochlorothiazide  (HYDRODIURIL ) 12.5 MG tablet Take 1 tablet (12.5 mg total) by mouth daily. 30 tablet 1   levocetirizine (XYZAL ) 5 MG tablet Take 1 tablet (5 mg total) by mouth every evening. 90 tablet 3   Multiple Vitamin (MULTIVITAMIN) capsule Take 1 capsule by mouth daily.     pantoprazole  (PROTONIX ) 20 MG tablet TAKE ONE TABLET BY MOUTH DAILY 30 MINUTES BEFORE FOOD 90 tablet 3   sildenafil  (REVATIO ) 20 MG tablet Take 1-5 tablets (20-100 mg total) by mouth daily as needed. 30 tablet 11   No current facility-administered medications for this visit.    Past Medical History:  Diagnosis Date   Allergy    Anxiety    Arthritis    Benign cyst of kidney    [facility] 04/11/22 4.8 cm [facility]   Chicken pox    Chronic gastritis    egd [facility] Dr. Emerick Hanlon 01/02/22 with fundic gastric polyps   Colon polyps    Fundic gland polyps of stomach, benign    Gastritis    with esophagitis    GERD  (gastroesophageal reflux disease)    Hiatal hernia    small noted egd 01/02/22 [facility] Dr. Emerick Hanlon   Hyperlipidemia    Hypertension    Liver cyst    04/11/22 [facility] ct ab/pelvis   Low back pain     Past Surgical History:  Procedure Laterality Date   APPENDECTOMY     COLONOSCOPY W/ POLYPECTOMY     ESOPHAGOGASTRODUODENOSCOPY     01/02/22 mild chronic gastritis and fundic gland plyps f/u prn neg H pylori Dr. Jane[facility]   EYE SURGERY  Lasik   KNEE SURGERY     arthroscopy x 2 torn cartilage    LASIK     b/l   TONSILLECTOMY       Social History   Tobacco Use   Smoking status: Never   Smokeless tobacco: Never  Substance Use Topics   Alcohol use: Yes    Alcohol/week: 2.0 standard drinks of alcohol    Types: 2 Glasses of wine per week   Drug use: Never      Family History  Problem Relation Age of Onset   Diabetes Mother    Hypertension Mother    Heart attack Mother        mid 77s   Diabetes Father    Hypertension Father    Hyperlipidemia Father  Colon polyps Father        precancerous    Diabetes Maternal Grandmother    Heart disease Maternal Grandfather        MI age 87s-50s    Hyperlipidemia Paternal Grandmother    Valvular heart disease Paternal Grandmother    Diabetes Paternal Grandfather    Cancer Paternal Grandfather        lung cancer smoker    Heart attack Paternal Grandfather        in age 30s      Allergies  Allergen Reactions   Hctz [Hydrochlorothiazide ]     Dizziness     Penicillins Rash    Rash as chid but able to tolerate amoxicillin        REVIEW OF SYSTEMS (Negative unless checked)   Constitutional: [] Weight loss  [] Fever  [] Chills Cardiac: [] Chest pain   [] Chest pressure   [] Palpitations   [] Shortness of breath when laying flat   [] Shortness of breath at rest   [] Shortness of breath with exertion. Vascular:  [] Pain in legs with walking   [] Pain in legs at rest   [] Pain in legs when laying flat   [] Claudication   [] Pain in feet when  walking  [] Pain in feet at rest  [] Pain in feet when laying flat   [] History of DVT   [] Phlebitis   [] Swelling in legs   [] Varicose veins   [] Non-healing ulcers Pulmonary:   [] Uses home oxygen   [] Productive cough   [] Hemoptysis   [] Wheeze  [] COPD   [] Asthma Neurologic:  [] Dizziness  [] Blackouts   [] Seizures   [] History of stroke   [] History of TIA  [] Aphasia   [] Temporary blindness   [] Dysphagia   [] Weakness or numbness in arms   [] Weakness or numbness in legs Musculoskeletal:  [x] Arthritis   [] Joint swelling   [] Joint pain   [x] Low back pain Hematologic:  [] Easy bruising  [] Easy bleeding   [] Hypercoagulable state   [] Anemic  [] Hepatitis Gastrointestinal:  [] Blood in stool   [] Vomiting blood  [x] Gastroesophageal reflux/heartburn   [] Abdominal pain Genitourinary:  [] Chronic kidney disease   [] Difficult urination  [] Frequent urination  [] Burning with urination   [] Hematuria Skin:  [] Rashes   [] Ulcers   [] Wounds Psychological:  [x] History of anxiety   []  History of major depression.  Physical Examination  BP 127/82   Pulse 62   Resp 18   Wt 154 lb 9.6 oz (70.1 kg)   BMI 24.95 kg/m  Gen:  WD/WN, NAD.  Appears younger than stated age Head: [place]/AT, No temporalis wasting. Ear/Nose/Throat: Hearing grossly intact, nares w/o erythema or drainage Eyes: Conjunctiva clear. Sclera non-icteric Neck: Supple.  Trachea midline Pulmonary:  Good air movement, no use of accessory muscles.  Cardiac: RRR, no JVD Vascular:  Vessel Right Left  Radial Palpable Palpable               Musculoskeletal: M/S 5/5 throughout.  No deformity or atrophy.  No edema. Neurologic: Sensation grossly intact in extremities.  Symmetrical.  Speech is fluent.  Psychiatric: Judgment intact, Mood & affect appropriate for pt's clinical situation. Dermatologic: No rashes or ulcers noted.  No cellulitis or open wounds.      Labs Recent Results (from the past 2160 hours)  PSA     Status: None   Collection Time: 05/19/24   8:03 AM  Result Value Ref Range   PSA 0.95 0.10 - 4.00 ng/mL    Comment: Test performed using Access Hybritech PSA Assay, a parmagnetic partical, chemiluminecent immunoassay.  Lipid panel     Status: None   Collection Time: 05/19/24  8:03 AM  Result Value Ref Range   Cholesterol 156 0 - 200 mg/dL    Comment: ATP III Classification       Desirable:  < 200 mg/dL               Borderline High:  200 - 239 mg/dL          High:  > = 161 mg/dL   Triglycerides 09.6 0.0 - 149.0 mg/dL    Comment: Normal:  <045 mg/dLBorderline High:  150 - 199 mg/dL   HDL 40.98 >11.91 mg/dL   VLDL 47.8 0.0 - 29.5 mg/dL   LDL Cholesterol 89 0 - 99 mg/dL   Total CHOL/HDL Ratio 3     Comment:                Men          Women1/2 Average Risk     3.4          3.3Average Risk          5.0          4.42X Average Risk          9.6          7.13X Average Risk          15.0          11.0                       NonHDL 106.23     Comment: NOTE:  Non-HDL goal should be 30 mg/dL higher than patient's LDL goal (i.e. LDL goal of < 70 mg/dL, would have non-HDL goal of < 100 mg/dL)  Comprehensive metabolic panel     Status: Abnormal   Collection Time: 05/19/24  8:03 AM  Result Value Ref Range   Sodium 142 135 - 145 mEq/L   Potassium 4.3 3.5 - 5.1 mEq/L   Chloride 103 96 - 112 mEq/L   CO2 33 (H) 19 - 32 mEq/L   Glucose, Bld 95 70 - 99 mg/dL   BUN 23 6 - 23 mg/dL   Creatinine, Ser 6.21 0.40 - 1.50 mg/dL   Total Bilirubin 0.9 0.2 - 1.2 mg/dL   Alkaline Phosphatase 89 39 - 117 U/L   AST 22 0 - 37 U/L   ALT 27 0 - 53 U/L   Total Protein 7.0 6.0 - 8.3 g/dL   Albumin 4.9 3.5 - 5.2 g/dL   GFR 30.86 >57.84 mL/min    Comment: Calculated using the CKD-EPI Creatinine Equation (2021)   Calcium  9.8 8.4 - 10.5 mg/dL  Microalbumin / creatinine urine ratio     Status: None   Collection Time: 05/26/24 10:03 AM  Result Value Ref Range   Microalb, Ur <0.7 mg/dL   Creatinine,U 69.6 mg/dL   Microalb Creat Ratio Unable to calculate 0.0 - 30.0  mg/g    Comment: Unable to Calculate due to Microalbumin Result of <0.7 mg/dL    Radiology VAS US  RENAL ARTERY DUPLEX Result Date: 05/26/2024 ABDOMINAL VISCERAL Patient Name:  Anthony Patton.  Date of Exam:   05/25/2024 Medical Rec #: [id number]             Accession #:    [id number] Date of Birth: Dec 03, 1971             Patient Gender: M Patient Age:   30 years Exam Location:  [facility] Procedure:      VAS US  RENAL ARTERY DUPLEX Referring Phys: [id number] Dago Jungwirth S Zamariya Neal -------------------------------------------------------------------------------- High Risk Factors: Hypertension. Performing Technologist: Faustine Hoof RVT  Examination Guidelines: A complete evaluation includes B-mode imaging, spectral Doppler, color Doppler, and power Doppler as needed of all accessible portions of each vessel. Bilateral testing is considered an integral part of a complete examination. Limited examinations for reoccurring indications may be performed as noted.  Duplex Findings: +----------+--------+--------+------+--------+ MesentericPSV cm/sEDV cm/sPlaqueComments +----------+--------+--------+------+--------+ Aorta Mid    84                          +----------+--------+--------+------+--------+    +------------------+--------+--------+-------+ Right Renal ArteryPSV cm/sEDV cm/sComment +------------------+--------+--------+-------+ Proximal             55                   +------------------+--------+--------+-------+ Mid                  59                   +------------------+--------+--------+-------+ Distal               45                   +------------------+--------+--------+-------+ +-----------------+--------+--------+-------+ Left Renal ArteryPSV cm/sEDV cm/sComment +-----------------+--------+--------+-------+ Proximal            62                   +-----------------+--------+--------+-------+ Mid                 45                    +-----------------+--------+--------+-------+ Distal              49                   +-----------------+--------+--------+-------+ +------------+--------+--------+----+-----------+--------+--------+----+ Right KidneyPSV cm/sEDV cm/sRI  Left KidneyPSV cm/sEDV cm/sRI   +------------+--------+--------+----+-----------+--------+--------+----+ Upper Pole                      Upper Pole                      +------------+--------+--------+----+-----------+--------+--------+----+ Mid         16      5       0.        25      8       0.69 +------------+--------+--------+----+-----------+--------+--------+----+ Lower Pole                      Lower Pole                      +------------+--------+--------+----+-----------+--------+--------+----+ Hilar                           Hilar                           +------------+--------+--------+----+-----------+--------+--------+----+ +------------------+-----+------------------+-----+ Right Kidney           Left Kidney             +------------------+-----+------------------+-----+ RAR                    RAR                     +------------------+-----+------------------+-----+  RAR (manual)      .70  RAR (manual)      .74   +------------------+-----+------------------+-----+ Cortex                 Cortex                  +------------------+-----+------------------+-----+ Cortex thickness       Corex thickness         +------------------+-----+------------------+-----+ Kidney length (cm)10.40Kidney length (cm)10.13 +------------------+-----+------------------+-----+  Summary: Renal:  Right: Normal size right kidney. Normal right Resisitive Index.        Normal cortical thickness of right kidney. No evidence of        right renal artery stenosis. RRV flow present. Left:  Normal size of left kidney. Normal left Resistive Index.        Normal cortical thickness of the left kidney. No evidence of         left renal artery stenosis.  *See table(s) above for measurements and observations.  Diagnosing physician: Mikki Alexander MD  Electronically signed by Mikki Alexander MD on 05/26/2024 at 10:06:43 AM.    Final     Assessment/Plan  Thoracic ascending aortic aneurysm [facility]) Check annually with CT scan.  Benign essential HTN A recent renal artery duplex demonstrated no evidence of significant renal artery stenosis in either kidney.  He has been referred to cardiology for further evaluation of his refractory hypertension as well.  Control of his blood pressure is important for reducing the risk of aneurysmal degeneration in the future.  Hyperlipidemia lipid control important in reducing the progression of atherosclerotic disease. Continue statin therapy    Mikki Alexander, MD  05/27/2024 10:01 AM    This note was created with Dragon medical transcription system.  Any errors from dictation are purely unintentional

## 2024-05-29 ENCOUNTER — Ambulatory Visit: Payer: Self-pay | Admitting: Internal Medicine

## 2024-05-29 NOTE — Assessment & Plan Note (Signed)
 Currently managed with hydrochlorothiazide  and amlodipine . He has no proteinuria.  No changes today   Lab Results  Component Value Date   LABMICR <3.0 03/19/2021   LABMICR Comment 03/12/2021   MICROALBUR <0.7 05/26/2024     Lab Results  Component Value Date   NA 142 05/19/2024   K 4.3 05/19/2024   CL 103 05/19/2024   CO2 33 (H) 05/19/2024

## 2024-05-29 NOTE — Assessment & Plan Note (Signed)
 Managed with atorvastatin  given elevated CAC score   Lab Results  Component Value Date   CHOL 156 05/19/2024   HDL 49.80 05/19/2024   LDLCALC 89 05/19/2024   TRIG 86.0 05/19/2024   CHOLHDL 3 05/19/2024

## 2024-05-31 ENCOUNTER — Encounter (INDEPENDENT_AMBULATORY_CARE_PROVIDER_SITE_OTHER)

## 2024-05-31 ENCOUNTER — Other Ambulatory Visit: Payer: Self-pay

## 2024-05-31 ENCOUNTER — Ambulatory Visit (INDEPENDENT_AMBULATORY_CARE_PROVIDER_SITE_OTHER): Admitting: Vascular Surgery

## 2024-05-31 DIAGNOSIS — I10 Essential (primary) hypertension: Secondary | ICD-10-CM

## 2024-06-01 MED ORDER — HYDROCHLOROTHIAZIDE 12.5 MG PO TABS: 12.5000 mg | ORAL_TABLET | Freq: Every day | ORAL | 1 refills | Status: DC

## 2024-08-15 ENCOUNTER — Encounter: Payer: Managed Care, Other (non HMO) | Admitting: Family Medicine

## 2024-08-15 ENCOUNTER — Encounter: Payer: Self-pay | Admitting: Internal Medicine

## 2024-08-15 DIAGNOSIS — F39 Unspecified mood [affective] disorder: Secondary | ICD-10-CM

## 2024-08-15 MED ORDER — BUSPIRONE HCL 5 MG PO TABS: 5.0000 mg | ORAL_TABLET | Freq: Two times a day (BID) | ORAL | 1 refills | Status: AC

## 2024-08-15 NOTE — Telephone Encounter (Signed)
 Buspirone  has been refilled with 90 day supply

## 2024-08-15 NOTE — Addendum Note (Signed)
 Addended by: MARYLYNN VERNEITA CROME on: 08/15/2024 09:35 AM   Modules accepted: Orders

## 2024-11-27 ENCOUNTER — Other Ambulatory Visit: Payer: Self-pay | Admitting: Internal Medicine

## 2024-11-27 DIAGNOSIS — I10 Essential (primary) hypertension: Secondary | ICD-10-CM

## 2025-06-02 ENCOUNTER — Ambulatory Visit (INDEPENDENT_AMBULATORY_CARE_PROVIDER_SITE_OTHER): Admitting: Vascular Surgery
